# Patient Record
Sex: Male | Born: 1985
Health system: Southern US, Community
[De-identification: ages and names within clinical notes are randomized; demographics above are authoritative.]

## PROBLEM LIST (undated history)

## (undated) DIAGNOSIS — U071 COVID-19: Secondary | ICD-10-CM

## (undated) HISTORY — PX: SHOULDER SURGERY: SHX246

---

## 2004-10-15 ENCOUNTER — Encounter: Admission: RE | Admit: 2004-10-15 | Discharge: 2004-10-15 | Payer: Self-pay | Admitting: Family Medicine

## 2005-04-12 ENCOUNTER — Encounter: Admission: RE | Admit: 2005-04-12 | Discharge: 2005-04-12 | Payer: Self-pay | Admitting: Family Medicine

## 2005-04-14 ENCOUNTER — Encounter: Admission: RE | Admit: 2005-04-14 | Discharge: 2005-04-14 | Payer: Self-pay | Admitting: Family Medicine

## 2006-01-05 ENCOUNTER — Ambulatory Visit: Payer: Self-pay | Admitting: Family Medicine

## 2007-07-18 ENCOUNTER — Ambulatory Visit: Payer: Self-pay | Admitting: Family Medicine

## 2007-07-19 ENCOUNTER — Encounter: Admission: RE | Admit: 2007-07-19 | Discharge: 2007-07-19 | Payer: Self-pay | Admitting: Family Medicine

## 2009-02-07 HISTORY — PX: KNEE SURGERY: SHX244

## 2009-10-28 ENCOUNTER — Encounter: Admission: RE | Admit: 2009-10-28 | Discharge: 2009-10-28 | Payer: Self-pay | Admitting: Family Medicine

## 2009-10-28 ENCOUNTER — Ambulatory Visit: Payer: Self-pay | Admitting: Physician Assistant

## 2009-10-29 ENCOUNTER — Encounter: Admission: RE | Admit: 2009-10-29 | Discharge: 2009-10-29 | Payer: Self-pay | Admitting: Family Medicine

## 2009-11-30 ENCOUNTER — Ambulatory Visit: Payer: Self-pay | Admitting: Family Medicine

## 2009-12-01 ENCOUNTER — Encounter: Admission: RE | Admit: 2009-12-01 | Discharge: 2009-12-01 | Payer: Self-pay | Admitting: Family Medicine

## 2009-12-05 ENCOUNTER — Observation Stay (HOSPITAL_COMMUNITY): Admission: EM | Admit: 2009-12-05 | Discharge: 2009-12-06 | Payer: Self-pay | Admitting: Emergency Medicine

## 2010-02-28 ENCOUNTER — Encounter: Payer: Self-pay | Admitting: Internal Medicine

## 2010-04-21 LAB — URINALYSIS, ROUTINE W REFLEX MICROSCOPIC
Bilirubin Urine: NEGATIVE
Nitrite: NEGATIVE
Specific Gravity, Urine: 1.011 (ref 1.005–1.030)
pH: 7.5 (ref 5.0–8.0)

## 2010-04-21 LAB — DIFFERENTIAL
Basophils Absolute: 0 10*3/uL (ref 0.0–0.1)
Eosinophils Absolute: 0.1 10*3/uL (ref 0.0–0.7)
Eosinophils Relative: 1 % (ref 0–5)
Monocytes Absolute: 0.7 10*3/uL (ref 0.1–1.0)

## 2010-04-21 LAB — COMPREHENSIVE METABOLIC PANEL
ALT: 22 U/L (ref 0–53)
AST: 22 U/L (ref 0–37)
Albumin: 4.7 g/dL (ref 3.5–5.2)
CO2: 28 mEq/L (ref 19–32)
Chloride: 104 mEq/L (ref 96–112)
GFR calc Af Amer: >60 mL/min (ref >60)
GFR calc non Af Amer: >60 mL/min (ref >60)
Potassium: 3.4 mEq/L — ABNORMAL LOW (ref 3.5–5.1)
Sodium: 142 mEq/L (ref 135–145)
Total Bilirubin: 1.9 mg/dL — ABNORMAL HIGH (ref 0.3–1.2)

## 2010-04-21 LAB — CBC
Hemoglobin: 15.9 g/dL (ref 13.0–17.0)
RBC: 5.19 MIL/uL (ref 4.22–5.81)
WBC: 7.7 10*3/uL (ref 4.0–10.5)

## 2010-04-21 LAB — LIPASE, BLOOD: Lipase: 23 U/L (ref 11–59)

## 2010-09-29 ENCOUNTER — Telehealth: Payer: Self-pay | Admitting: Family Medicine

## 2010-09-30 ENCOUNTER — Telehealth: Payer: Self-pay

## 2010-09-30 NOTE — Telephone Encounter (Signed)
Called the pt to inform him that this wasn't an issue

## 2010-09-30 NOTE — Telephone Encounter (Signed)
Let him know that this is not an issue. He has nothing to worry about

## 2010-10-05 NOTE — Telephone Encounter (Signed)
CHERI CALLED PT KDS

## 2010-12-09 ENCOUNTER — Emergency Department (HOSPITAL_COMMUNITY)
Admission: EM | Admit: 2010-12-09 | Discharge: 2010-12-09 | Disposition: A | Discharge status: Home / Self Care | Payer: BC Managed Care – PPO | Attending: Emergency Medicine | Admitting: Emergency Medicine

## 2010-12-09 DIAGNOSIS — R1011 Right upper quadrant pain: Secondary | ICD-10-CM | POA: Insufficient documentation

## 2010-12-09 DIAGNOSIS — R197 Diarrhea, unspecified: Secondary | ICD-10-CM | POA: Insufficient documentation

## 2010-12-09 DIAGNOSIS — K5289 Other specified noninfective gastroenteritis and colitis: Secondary | ICD-10-CM | POA: Insufficient documentation

## 2010-12-09 LAB — URINALYSIS, ROUTINE W REFLEX MICROSCOPIC
Bilirubin Urine: NEGATIVE
Hgb urine dipstick: NEGATIVE
Ketones, ur: NEGATIVE mg/dL
Nitrite: NEGATIVE
Urobilinogen, UA: 0.2 mg/dL (ref 0.0–1.0)
pH: 6.5 (ref 5.0–8.0)

## 2010-12-09 LAB — DIFFERENTIAL

## 2010-12-09 LAB — COMPREHENSIVE METABOLIC PANEL
ALT: 17 U/L (ref 0–53)
AST: 16 U/L (ref 0–37)
Albumin: 4.5 g/dL (ref 3.5–5.2)
Calcium: 9.7 mg/dL (ref 8.4–10.5)
Creatinine, Ser: 1.03 mg/dL (ref 0.50–1.35)
GFR calc non Af Amer: >90 mL/min (ref >90)
Sodium: 143 mEq/L (ref 135–145)
Total Protein: 7.1 g/dL (ref 6.0–8.3)

## 2010-12-09 LAB — CBC
MCH: 31.1 pg (ref 26.0–34.0)
MCHC: 35.8 g/dL (ref 30.0–36.0)
MCV: 86.9 fL (ref 78.0–100.0)
Platelets: 206 10*3/uL (ref 150–400)
RBC: 5.18 MIL/uL (ref 4.22–5.81)
RDW: 11.5 % (ref 11.5–15.5)

## 2010-12-13 ENCOUNTER — Ambulatory Visit
Admission: RE | Admit: 2010-12-13 | Discharge: 2010-12-13 | Disposition: A | Discharge status: Home / Self Care | Payer: BC Managed Care – PPO | Source: Ambulatory Visit | Attending: Family Medicine | Admitting: Family Medicine

## 2010-12-13 ENCOUNTER — Other Ambulatory Visit: Payer: Self-pay | Admitting: Family Medicine

## 2010-12-13 DIAGNOSIS — R1011 Right upper quadrant pain: Secondary | ICD-10-CM

## 2012-01-02 ENCOUNTER — Ambulatory Visit (INDEPENDENT_AMBULATORY_CARE_PROVIDER_SITE_OTHER): Payer: BC Managed Care – PPO | Admitting: Family Medicine

## 2012-01-02 ENCOUNTER — Other Ambulatory Visit: Payer: Self-pay

## 2012-01-02 ENCOUNTER — Ambulatory Visit
Admission: RE | Admit: 2012-01-02 | Discharge: 2012-01-02 | Disposition: A | Discharge status: Home / Self Care | Payer: BC Managed Care – PPO | Source: Ambulatory Visit | Attending: Family Medicine | Admitting: Family Medicine

## 2012-01-02 VITALS — BP 118/78 | HR 76 | Temp 97.6°F | Resp 18 | Ht 71.25 in | Wt 149.8 lb

## 2012-01-02 DIAGNOSIS — R51 Headache: Secondary | ICD-10-CM

## 2012-01-02 LAB — POCT CBC
Hemoglobin: 15.9 g/dL (ref 14.1–18.1)
Lymph, poc: 2.1 (ref 0.6–3.4)
MCH, POC: 30.2 pg (ref 27–31.2)
MCHC: 32 g/dL (ref 31.8–35.4)
MPV: 10.5 fL (ref 0–99.8)
POC Granulocyte: 4.1 (ref 2–6.9)
POC LYMPH PERCENT: 32.5 %L (ref 10–50)
POC MID %: 6.1 %M (ref 0–12)
RDW, POC: 12.8 %
WBC: 6.6 10*3/uL (ref 4.6–10.2)

## 2012-01-02 LAB — TSH: TSH: 0.673 u[IU]/mL (ref 0.350–4.500)

## 2012-01-02 LAB — BASIC METABOLIC PANEL
Potassium: 4.3 mEq/L (ref 3.5–5.3)
Sodium: 143 mEq/L (ref 135–145)

## 2012-01-02 MED ORDER — HYDROCODONE-ACETAMINOPHEN 5-500 MG PO TABS: 1.0000 | ORAL_TABLET | Freq: Three times a day (TID) | ORAL | Status: DC | PRN

## 2012-01-02 MED ORDER — SUMATRIPTAN SUCCINATE 100 MG PO TABS: ORAL_TABLET | ORAL | Status: DC

## 2012-01-02 NOTE — Patient Instructions (Addendum)
You will go to [phone] [place] [facility]) at [facility] for your MRI.  You will receive IV contrast.  After your scan you may go home and I will call you with the result.  Assuming you are negative we will try treating you for a migraine HA

## 2012-01-02 NOTE — Progress Notes (Signed)
[facility] [address] [place] [address] [phone]  Date:  01/02/2012   Name:  Jeffrey Wells.   DOB:  10-Dec-1985   MRN:  [id number]  PCP:  No primary provider on file.    Chief Complaint: Headache   History of Present Illness:  Jeffrey L Lex Linhares. is a 26 y.o. very pleasant male patient who presents with the following:  He has noted HA for about 10 days.  This past weekend the headache seemed to settle in his forehead. It tends to be more unilateral.  He does not feel congested or like he has a sinus infection.    No fever, ST, congestion or cough.   His neck and back have felt sore as well.    He has a distant history of ?migraine HA- he was told he had migraines in the 8th grade.  However, he has not had anything like a migraine since his teens.    He states he rarely has HA.  When he was in the 8th grade it turned out that he needed glasses and his HAs resolved.    No head injury.    He has not had any vomiting, but he did have nausea over the weekend.  His stomach now feels better, but his appetite is not as good as unusual.    The HA has come and gone over the last 10 days.  He has woken up with the HA nearly every day.  He has tried ibuprofen, OTC migraine medication such as excedrin.    He is generally healthy.   No major changes recently in his life, no major stressors.   This morning he may have had a visual aura. He saw some spots in his vision.  His mother does have a history of migraine.     At the moment his HA is not as severe, but he has never had a HA that lasted so long. He is concerned about would like to pursue further evaluation including imaging.   There is no problem list on file for this patient.   No past medical history on file.  Past Surgical History  Procedure Date  . Knee surgery 2011    History  Substance Use Topics  . Smoking status: Former Games developer  . Smokeless tobacco: Not on file  . Alcohol Use:  Not on file    No family history on file.  No Known Allergies  Medication list has been reviewed and updated.  No current outpatient prescriptions on file prior to visit.    Review of Systems:  As per HPI- otherwise negative.   Physical Examination: Filed Vitals:   01/02/12 1125  BP: 118/78  Pulse: 76  Temp: 97.6 F (36.4 C)  Resp: 18   Filed Vitals:   01/02/12 1125  Height: 5' 11.25" (1.81 m)  Weight: 149 lb 12.8 oz (67.949 kg)   Body mass index is 20.75 kg/(m^2). Ideal Body Weight: Weight in (lb) to have BMI = 25: 180.1   GEN: WDWN, NAD, Non-toxic, A & O x 3.  Looks well and comfortable HEENT: Atraumatic, Normocephalic. Neck supple. No masses, No LAD. Bilateral TM wnl, oropharynx normal.  PEERL,EOMI.   Ears and Nose: No external deformity. CV: RRR, No M/G/R. No JVD. No thrill. No extra heart sounds. PULM: CTA B, no wheezes, crackles, rhonchi. No retractions. No resp. distress. No accessory muscle use. ABD: S, NT, ND, +BS. No rebound. No HSM. EXTR:  No c/c/e NEURO Normal gait. Normal strength, sensation, DTR all extremities.  Negative arm drift, no meningismus PSYCH: Normally interactive. Conversant. Not depressed or anxious appearing.  Calm demeanor.   Results for orders placed in visit on 01/02/12  POCT CBC      Component Value Range   WBC 6.6  4.6 - 10.2 K/uL   Lymph, poc 2.1  0.6 - 3.4   POC LYMPH PERCENT 32.5  10 - 50 %L   MID (cbc) 0.4  0 - 0.9   POC MID % 6.1  0 - 12 %M   POC Granulocyte 4.1  2 - 6.9   Granulocyte percent 61.4  37 - 80 %G   RBC 5.27  4.69 - 6.13 M/uL   Hemoglobin 15.9  14.1 - 18.1 g/dL   HCT, POC 40.9  81.1 - 53.7 %   MCV 94.3  80 - 97 fL   MCH, POC 30.2  27 - 31.2 pg   MCHC 32.0  31.8 - 35.4 g/dL   RDW, POC 91.4     Platelet Count, POC 255  142 - 424 K/uL   MPV 10.5  0 - 99.8 fL  GLUCOSE, POCT (MANUAL RESULT ENTRY)      Component Value Range   POC Glucose 75  70 - 99 mg/dl    Assessment and Plan: 1. Headache  POCT CBC, POCT  glucose (manual entry), Basic metabolic panel, TSH, MR Brain Wo Contrast   Nedra Hai is having an unusual HA today- we will perform an MRI with contrast.  He would like to go ahead with imaging as this HA is unusual for him.   Annalina Needles, MD  MRI HEAD WITHOUT CONTRAST  Technique: Multiplanar, multiecho pulse sequences of the brain and surrounding structures were obtained according to standard protocol without intravenous contrast.  Comparison: None.  Findings: No acute intracranial abnormalities are present. Specifically, there is no evidence for acute infarct, hemorrhage, mass, hydrocephalus, or significant extra-axial fluid collection. Flow is present in the major intracranial arteries. The globes and orbits are intact. Mild mucosal thickening is present along the inferior aspect of the maxillary sinuses bilaterally.  IMPRESSION:  1. Minimal maxillary sinus disease. 2. Normal MRI of the brain.   Called and let him know that his MRI was ok.  MRI accidentally done without contrast.  He will return tomorrow for a repeat with contrast.  He does have minimal sinus disease- however his symptoms and CBC do not suggest that this is the cause of his symptoms.  Suspect he is having migraine HA. Will still proceed with MRI with contrast tomorrow  For the time being called in hydrocodone (he has taken in the past for knee surgery and did well) and also imitrex that he can try for his HA.  Meds ordered this encounter  Medications  . SUMAtriptan (IMITREX) 100 MG tablet    Sig: Try a 1/2 pill for migraine headache.  May take a second half pill if needed.   Max two pills in 24 hours    Dispense:  10 tablet    Refill:  0  . HYDROcodone-acetaminophen (VICODIN) 5-500 MG per tablet    Sig: Take 1 tablet by mouth every 8 (eight) hours as needed for pain.    Dispense:  20 tablet    Refill:  0

## 2012-01-03 ENCOUNTER — Other Ambulatory Visit: Payer: BC Managed Care – PPO

## 2012-01-03 ENCOUNTER — Telehealth: Payer: Self-pay | Admitting: Family Medicine

## 2012-01-03 NOTE — Telephone Encounter (Signed)
Called to check on him- asked him to let me know if not better.  Unsure if he decided to go back for contrasted MRI or not today

## 2012-01-06 ENCOUNTER — Telehealth: Payer: Self-pay | Admitting: Family Medicine

## 2012-01-06 NOTE — Telephone Encounter (Signed)
Called and LMOM- I will send him a copy of his labs and MRI- if he still has a HA please let me know

## 2012-01-08 ENCOUNTER — Encounter: Payer: Self-pay | Admitting: Family Medicine

## 2012-05-07 ENCOUNTER — Ambulatory Visit (INDEPENDENT_AMBULATORY_CARE_PROVIDER_SITE_OTHER): Payer: BC Managed Care – PPO | Admitting: Internal Medicine

## 2012-05-07 VITALS — BP 130/78 | HR 82 | Temp 97.8°F | Resp 18 | Ht 76.0 in | Wt 146.4 lb

## 2012-05-07 DIAGNOSIS — H6121 Impacted cerumen, right ear: Secondary | ICD-10-CM

## 2012-05-07 DIAGNOSIS — H6120 Impacted cerumen, unspecified ear: Secondary | ICD-10-CM

## 2012-05-07 DIAGNOSIS — J019 Acute sinusitis, unspecified: Secondary | ICD-10-CM

## 2012-05-07 MED ORDER — AZITHROMYCIN 500 MG PO TABS: 500.0000 mg | ORAL_TABLET | Freq: Every day | ORAL | Status: DC

## 2012-05-07 NOTE — Patient Instructions (Signed)
Take meds as directed. Return to 104 for eval for add. If your symptoms worsen return to 102/

## 2012-05-07 NOTE — Progress Notes (Signed)
  Subjective:    Patient ID: Edwin Cap., male    DOB: 01-11-1986, 27 y.o.   MRN: [id number]  HPI Pressure in his head Facial pain Postnasal drip Fullness and decrease in hearing in his right ear Onset of symptoms yesterday worse today moderate in severity  No fever no cough no shortness of breath Also co difficulty concentrating when he is at night class after a full day at work wonders if he has add. Never tested and has not taken any meds for add  Review of Systems  Constitutional: Negative.   HENT: Positive for hearing loss, congestion and postnasal drip.   All other systems reviewed and are negative.       Objective:   Physical Exam  Nursing note and vitals reviewed. Constitutional: He is oriented to person, place, and time. He appears well-developed and well-nourished.  HENT:  Head: Normocephalic and atraumatic.  Left Ear: External ear normal.  Right ear with cerumen impaction  Eyes: Conjunctivae and EOM are normal. Pupils are equal, round, and reactive to light.  Neck: Normal range of motion. Neck supple.  Cardiovascular: Normal rate, regular rhythm, normal heart sounds and intact distal pulses.   Pulmonary/Chest: Effort normal and breath sounds normal.  Abdominal: Soft. Bowel sounds are normal.  Musculoskeletal: Normal range of motion.  Neurological: He is alert and oriented to person, place, and time. He has normal reflexes.  Skin: Skin is warm and dry.  Psychiatric: He has a normal mood and affect. His behavior is normal. Judgment and thought content normal.          Assessment & Plan:  sinusitus in otherwise well 27 year old male . Will rx with antibiotics and antihistamines. Also recommend debrox for cerumen impaction and will refer to 104 for evaluation of add.

## 2012-05-30 IMAGING — CT CT ABD-PELV W/O CM
2 of 4 series · 17 of 46 positions shown, 19 images · non-contrast
Comparison: None.

CLINICAL DATA: Flank pain, severe dysuria, low back pain.

CT ABDOMEN AND PELVIS WITHOUT CONTRAST
TECHNIQUE: Multidetector CT imaging of the abdomen and pelvis was
performed following the standard protocol without intravenous
contrast.

[Series 2: renal stone w/o · axial · non-contrast · 0.70mm/px · z∈[-436,-46]mm · 14 of 90 slices shown, 16 images]
[im 6/90  soft-tissue]
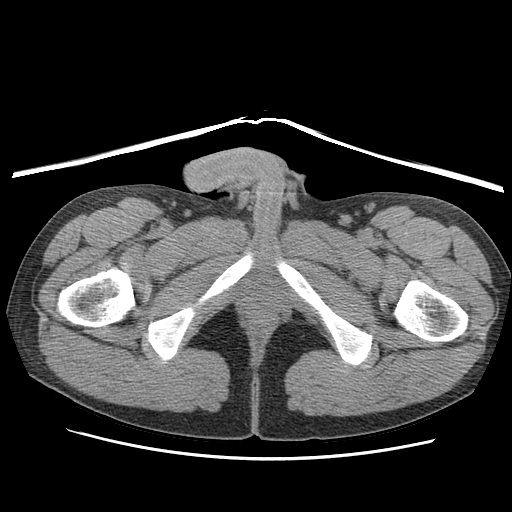
[im 6/90  bone]
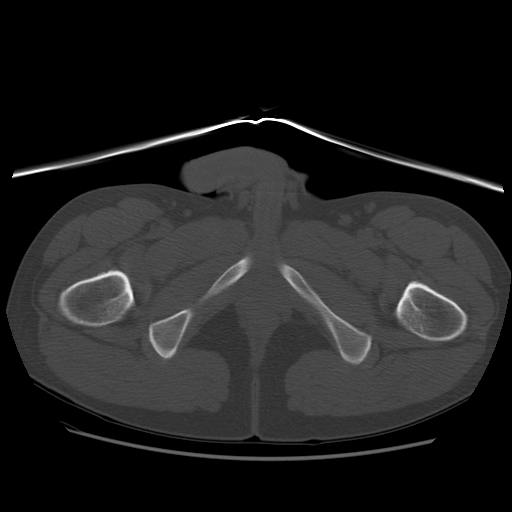
[im 12/90  soft-tissue]
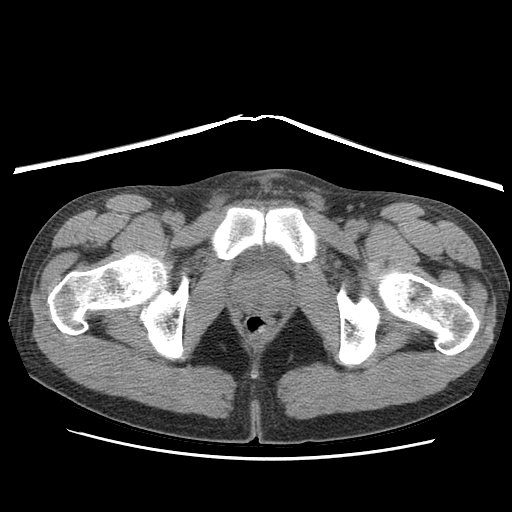
[im 18/90  soft-tissue]
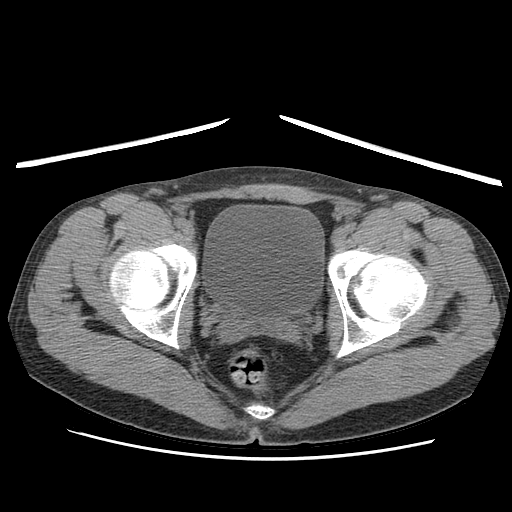
[im 24/90  soft-tissue]
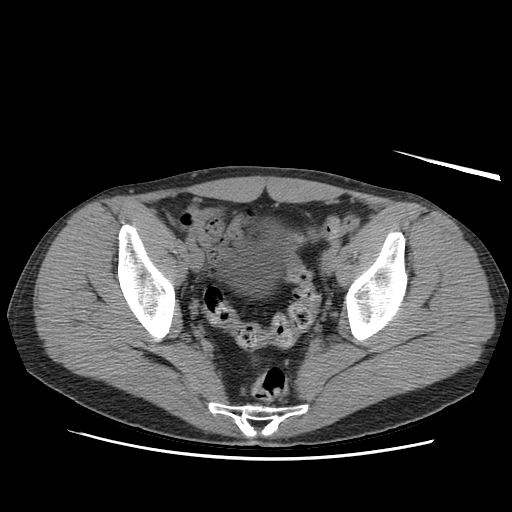
[im 30/90  soft-tissue]
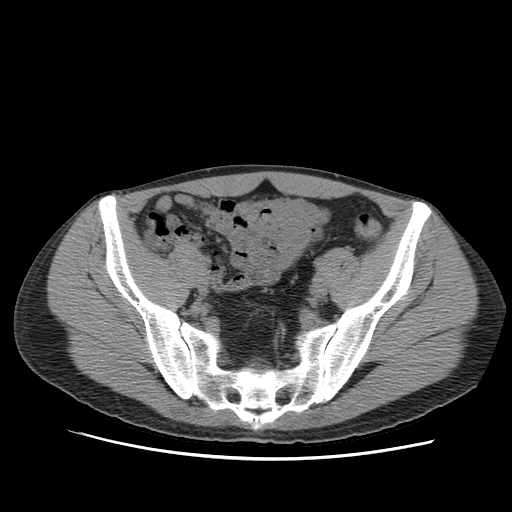
[im 36/90  soft-tissue]
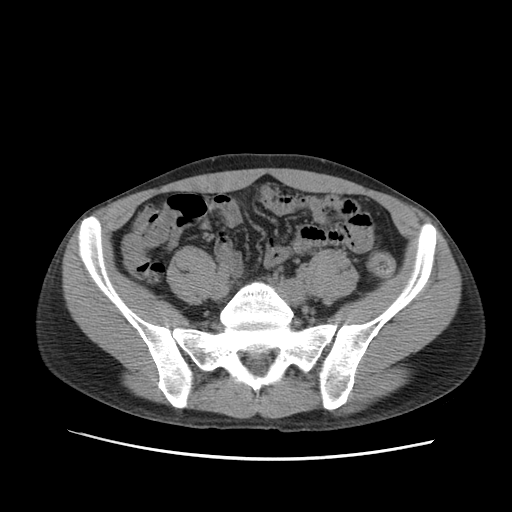
[im 42/90  soft-tissue]
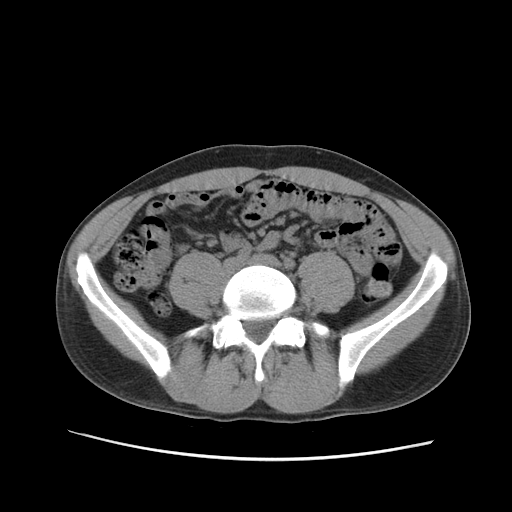
[im 48/90  soft-tissue]
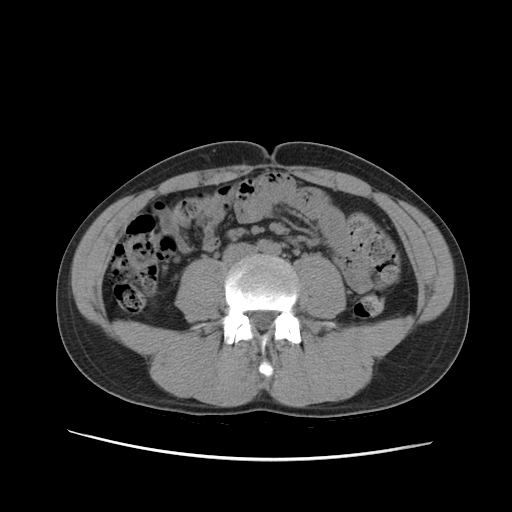
[im 54/90  soft-tissue]
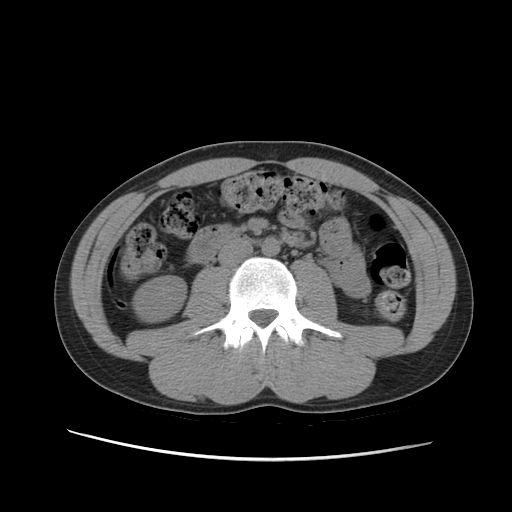
[im 54/90  bone]
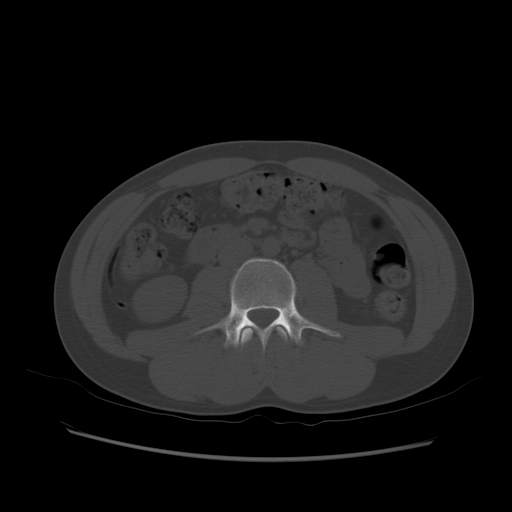
[im 60/90  soft-tissue]
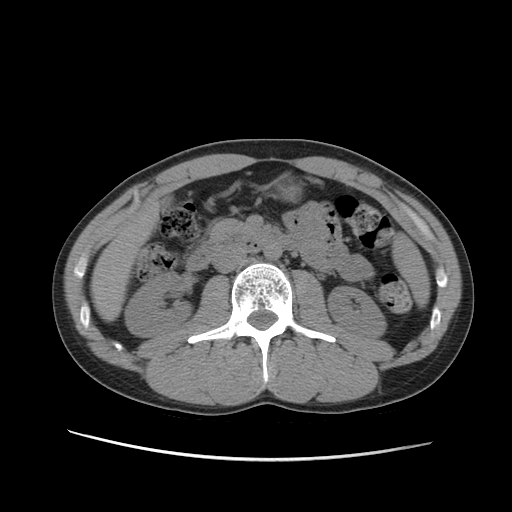
[im 66/90  soft-tissue]
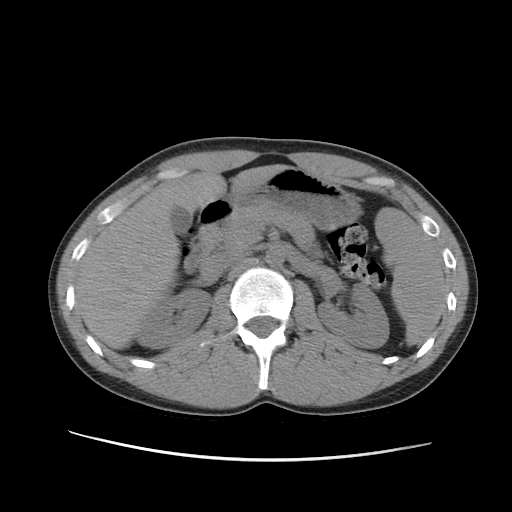
[im 72/90  soft-tissue]
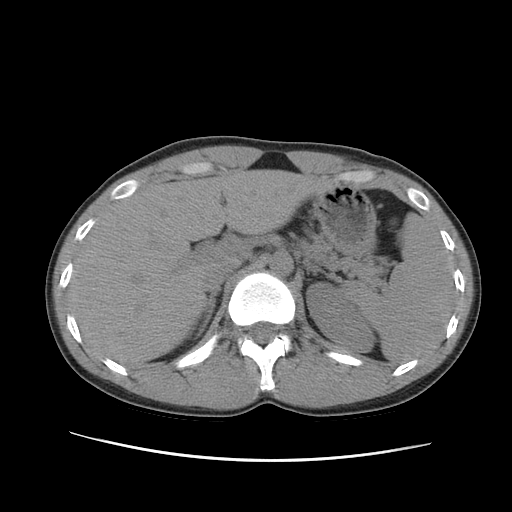
[im 78/90  soft-tissue]
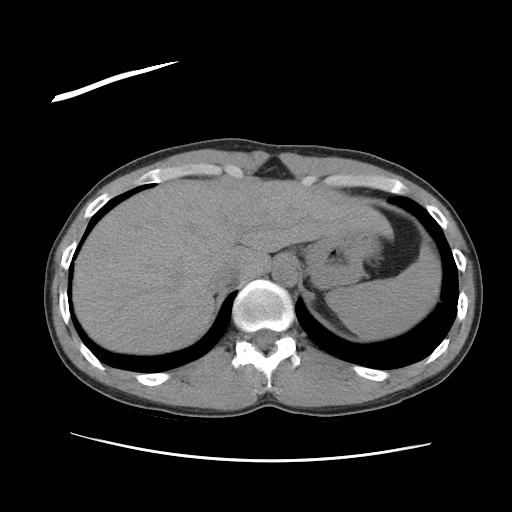
[im 84/90  soft-tissue]
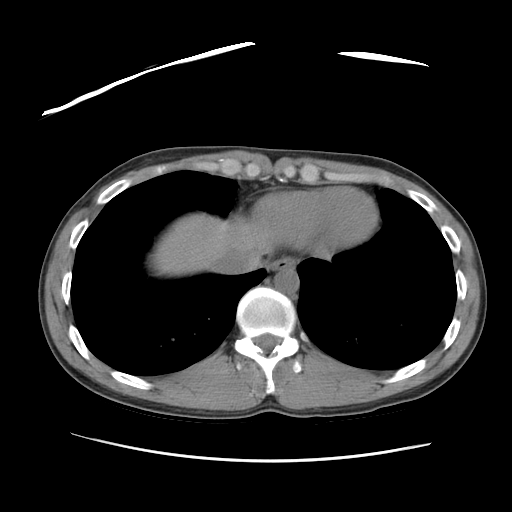

[Series 400: cor · coronal · 0.89mm/px · 3 of 88 slices shown]
[im 30/88  soft-tissue]
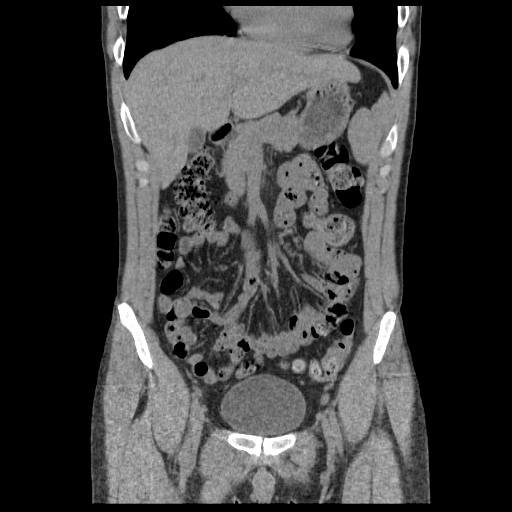
[im 39/88  soft-tissue]
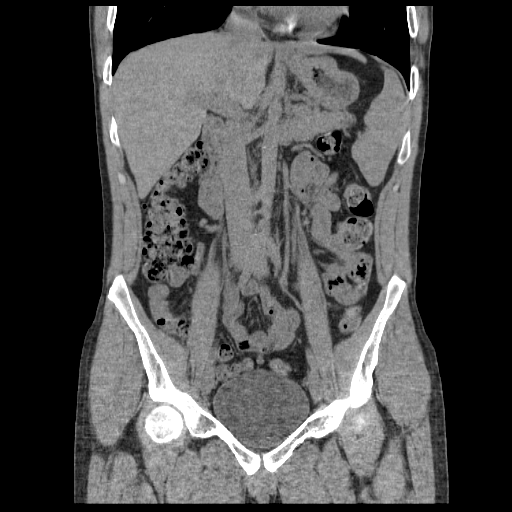
[im 49/88  soft-tissue]
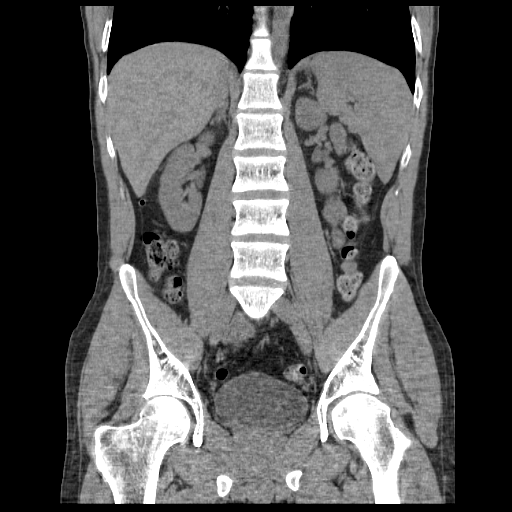

[17 of 46 positions shown; findings below may reference images not displayed]

FINDINGS: Lung bases show linear subsegmental atelectasis or
scarring in the left lower lobe.  Heart size normal.  No
pericardial or pleural effusion.

Liver, gallbladder, adrenal glands, kidneys, spleen, pancreas,
stomach and small bowel are unremarkable.  A fair amount of stool
is seen in the colon.  Appendix normal.  No pathologically enlarged
lymph nodes. No free fluid. No worrisome lytic or sclerotic
lesions.
IMPRESSION: No acute findings.

## 2016-05-25 DIAGNOSIS — M545 Low back pain: Secondary | ICD-10-CM | POA: Diagnosis not present

## 2016-05-28 DIAGNOSIS — M545 Low back pain: Secondary | ICD-10-CM | POA: Diagnosis not present

## 2017-01-24 DIAGNOSIS — Z0000 Encounter for general adult medical examination without abnormal findings: Secondary | ICD-10-CM | POA: Diagnosis not present

## 2017-07-28 DIAGNOSIS — J0100 Acute maxillary sinusitis, unspecified: Secondary | ICD-10-CM | POA: Diagnosis not present

## 2017-08-16 DIAGNOSIS — M545 Low back pain: Secondary | ICD-10-CM | POA: Diagnosis not present

## 2017-11-05 DIAGNOSIS — J324 Chronic pansinusitis: Secondary | ICD-10-CM | POA: Diagnosis not present

## 2017-12-18 ENCOUNTER — Emergency Department (HOSPITAL_COMMUNITY): Payer: 59

## 2017-12-18 ENCOUNTER — Encounter (HOSPITAL_COMMUNITY): Payer: Self-pay | Admitting: Internal Medicine

## 2017-12-18 ENCOUNTER — Emergency Department (HOSPITAL_COMMUNITY)
Admission: EM | Admit: 2017-12-18 | Discharge: 2017-12-18 | Disposition: A | Discharge status: Home / Self Care | Payer: 59 | Attending: Emergency Medicine | Admitting: Emergency Medicine

## 2017-12-18 DIAGNOSIS — R0789 Other chest pain: Secondary | ICD-10-CM | POA: Diagnosis not present

## 2017-12-18 DIAGNOSIS — R079 Chest pain, unspecified: Secondary | ICD-10-CM | POA: Diagnosis not present

## 2017-12-18 DIAGNOSIS — Z79899 Other long term (current) drug therapy: Secondary | ICD-10-CM | POA: Diagnosis not present

## 2017-12-18 DIAGNOSIS — Z87891 Personal history of nicotine dependence: Secondary | ICD-10-CM | POA: Diagnosis not present

## 2017-12-18 LAB — I-STAT TROPONIN, ED: TROPONIN I, POC: 0 ng/mL (ref 0.00–0.08)

## 2017-12-18 LAB — BASIC METABOLIC PANEL
ANION GAP: 7 (ref 5–15)
BUN: 11 mg/dL (ref 6–20)
CALCIUM: 9.2 mg/dL (ref 8.9–10.3)
CO2: 28 mmol/L (ref 22–32)
CREATININE: 0.96 mg/dL (ref 0.61–1.24)
Chloride: 101 mmol/L (ref 98–111)
GLUCOSE: 94 mg/dL (ref 70–99)
Potassium: 3.6 mmol/L (ref 3.5–5.1)
Sodium: 136 mmol/L (ref 135–145)

## 2017-12-18 LAB — CBC
HCT: 46.4 % (ref 39.0–52.0)
Hemoglobin: 15.4 g/dL (ref 13.0–17.0)
MCH: 29.9 pg (ref 26.0–34.0)
MCHC: 33.2 g/dL (ref 30.0–36.0)
MCV: 90.1 fL (ref 80.0–100.0)
PLATELETS: 240 10*3/uL (ref 150–400)
RBC: 5.15 MIL/uL (ref 4.22–5.81)
RDW: 11.7 % (ref 11.5–15.5)
WBC: 6.8 10*3/uL (ref 4.0–10.5)
nRBC: 0 % (ref 0.0–0.2)

## 2017-12-18 LAB — D-DIMER, QUANTITATIVE: D-Dimer, Quant: 0.29 ug/mL-FEU (ref 0.00–0.50)

## 2017-12-18 MED ORDER — METHOCARBAMOL 500 MG PO TABS: 500.0000 mg | ORAL_TABLET | Freq: Two times a day (BID) | ORAL | 0 refills | Status: DC

## 2017-12-18 MED ORDER — METHOCARBAMOL 500 MG PO TABS
500.0000 mg | ORAL_TABLET | Freq: Once | ORAL | Status: AC
  Administered 2017-12-18: 500 mg via ORAL
  Filled 2017-12-18: qty 1

## 2017-12-18 MED ORDER — NAPROXEN 500 MG PO TABS: 500.0000 mg | ORAL_TABLET | Freq: Two times a day (BID) | ORAL | 0 refills | Status: DC

## 2017-12-18 MED ORDER — MORPHINE SULFATE (PF) 4 MG/ML IV SOLN
4.0000 mg | Freq: Once | INTRAVENOUS | Status: AC
  Administered 2017-12-18: 4 mg via INTRAVENOUS
  Filled 2017-12-18: qty 1

## 2017-12-18 NOTE — ED Notes (Signed)
ED Provider at bedside. 

## 2017-12-18 NOTE — ED Notes (Signed)
Patient ambulatory to bathroom with steady gait at this time 

## 2017-12-18 NOTE — ED Triage Notes (Signed)
Pt here c/o new sharp chest pain that started yesterday morning. Pt states "I could not play legos with my kids without becoming short of breath." Pt sent here from urgent care this morning for evaluation. No cardiac/respiratory hx. VSS at this time.

## 2017-12-18 NOTE — ED Notes (Signed)
Patient given sandwich to eat

## 2017-12-18 NOTE — ED Provider Notes (Signed)
[facility] EMERGENCY DEPARTMENT Provider Note   CSN: [id number] Arrival date & time: 12/18/17  0932     History   Chief Complaint Chief Complaint  Patient presents with  . Chest Pain    HPI Jeffrey L Sachin Ferencz. is a 32 y.o. male with no signficant past medical history of presents to the ED today for chest pain and shortness of breath.  Patient reports that yesterday he awoke with a sharp pain over his left chest.  He reports that this was associated with shortness of breath and generalized fatigue.  Reports that the pain lasted for approximately 20-30 minutes before resolving.  Afterwards resolved the patient was left with a pressure-like sensation over his chest that is remained constant since that time.  He reports that his shortness of breath has been worse with exertion such as when he has tried to play Legos with his children.  He reports this usually does not make him short of breath.  He reports his chest pain is not worsened by exertion.  He feels at times it may be worse when he lies down.  He denies any pleuritic chest pain.  The patient denies any radiation of his pain to his neck, jaw, back, either shoulder or to his abdomen.  There is no associated nausea or diaphoresis.  He denies history of similar pain in the past.  Patient denies any associated fever, cough or lower leg swelling.  No recent illnesses.  Patient denies history of prior MI.  No history of diabetes, hypertension or hyperlipidemia.  He denies any daily medications or over-the-counter medications.  He denies illicit drug use.  No prior heart catheterizations, stress test or echocardiograms.  Patient does report family history of cardiac disease including his mother had an MI at the age of 64.  Patient is a former smoker and continues to use a vape pen intermittently when he drinks. Denies risk factors for PE including exogenous testosterone use, recent surgery,l recent travel (long care rides or plane  trips), trauma, immobilization, previous blood clot (PE/DVT), cough, hemoptysis, personal hx of cancer, lower extremity pain or swelling, or family history of clotting disorder.   HPI  History reviewed. No pertinent past medical history.  There are no active problems to display for this patient.   Past Surgical History:  Procedure Laterality Date  . KNEE SURGERY  2011        Home Medications    Prior to Admission medications   Medication Sig Start Date End Date Taking? Authorizing Provider  azithromycin (ZITHROMAX) 500 MG tablet Take 1 tablet (500 mg total) by mouth daily. 05/07/12   Glennie Isle L, DO  HYDROcodone-acetaminophen (VICODIN) 5-500 MG per tablet Take 1 tablet by mouth every 8 (eight) hours as needed for pain. 01/02/12   Copland, Gwenlyn Found, MD  SUMAtriptan (IMITREX) 100 MG tablet Try a 1/2 pill for migraine headache.  May take a second half pill if needed.   Max two pills in 24 hours 01/02/12   Copland, Gwenlyn Found, MD    Family History No family history on file.  Social History Social History   Tobacco Use  . Smoking status: Former Smoker  Substance Use Topics  . Alcohol use: Not on file  . Drug use: Not on file     Allergies   Patient has no known allergies.   Review of Systems Review of Systems  All other systems reviewed and are negative.    Physical Exam Updated Vital  Signs BP (!) 138/97   Pulse 75   Temp 97.7 F (36.5 C) (Oral)   Resp 14   Ht 6' (1.829 m)   Wt 68 kg   SpO2 100%   BMI 20.34 kg/m   Physical Exam  Constitutional: He appears well-developed and well-nourished.  HENT:  Head: Normocephalic and atraumatic.  Right Ear: External ear normal.  Left Ear: External ear normal.  Nose: Nose normal.  Mouth/Throat: Uvula is midline, oropharynx is clear and moist and mucous membranes are normal. No tonsillar exudate.  Eyes: Pupils are equal, round, and reactive to light. Right eye exhibits no discharge. Left eye exhibits no  discharge. No scleral icterus.  Neck: Trachea normal. Neck supple. No spinous process tenderness present. No neck rigidity. Normal range of motion present.  Cardiovascular: Normal rate, regular rhythm and intact distal pulses.  No murmur heard. Pulses:      Radial pulses are 2+ on the right side, and 2+ on the left side.       Dorsalis pedis pulses are 2+ on the right side, and 2+ on the left side.       Posterior tibial pulses are 2+ on the right side, and 2+ on the left side.  No lower extremity swelling or edema. Calves symmetric in size bilaterally.  Pulmonary/Chest: Effort normal and breath sounds normal. He exhibits tenderness.  Abdominal: Soft. Bowel sounds are normal. There is no tenderness. There is no rigidity, no rebound, no guarding and negative Murphy's sign.  Musculoskeletal: He exhibits no edema.  Lymphadenopathy:    He has no cervical adenopathy.  Neurological: He is alert.  Skin: Skin is warm and dry. No rash noted. He is not diaphoretic.  Psychiatric: He has a normal mood and affect.  Nursing note and vitals reviewed.    ED Treatments / Results  Labs (all labs ordered are listed, but only abnormal results are displayed) Labs Reviewed  BASIC METABOLIC PANEL  CBC  D-DIMER, QUANTITATIVE (NOT AT [facility])  I-STAT TROPONIN, ED    EKG EKG Interpretation  Date/Time:  Monday December 18 2017 09:42:55 EST Ventricular Rate:  70 PR Interval:    QRS Duration: 87 QT Interval:  395 QTC Calculation: 427 R Axis:   82 Text Interpretation:  Sinus rhythm Probable left atrial enlargement No previous tracing Confirmed by Cathren Laine ([id number]) on 12/18/2017 10:04:46 AM   Radiology Dg Chest 2 View  Result Date: 12/18/2017 CLINICAL DATA:  Chest pain. EXAM: CHEST - 2 VIEW COMPARISON:  Radiographs of October 29, 2009. FINDINGS: The heart size and mediastinal contours are within normal limits. Both lungs are clear. No pneumothorax or pleural effusion is noted. The visualized  skeletal structures are unremarkable. IMPRESSION: No active cardiopulmonary disease. Electronically Signed   By: Lupita Raider, M.D.   On: 12/18/2017 10:25    Procedures Procedures (including critical care time)  Medications Ordered in ED Medications  morphine 4 MG/ML injection 4 mg (has no administration in time range)  methocarbamol (ROBAXIN) tablet 500 mg (has no administration in time range)     Initial Impression / Assessment and Plan / ED Course  I have reviewed the triage vital signs and the nursing notes.  Pertinent labs & imaging results that were available during my care of the patient were reviewed by me and considered in my medical decision making (see chart for details).     32 y.o. male presenting for chest pain and sob that began yesterday and have been constant since onset. Chest  pain is non-exertional and without radiation, n/v or diaphoresis. Patient presented with chest pain to the ED. Patient is to be discharged with recommendation to follow up with PCP in regards to today's hospital visit. Chest pain is not likely of cardiac or pulmonary etiology due to presentation, perc & wells negative, negative d-dimer, stable vital signs, no tracheal deviation, no JVD or new murmur, RRR, breath sounds equal bilaterally, EKG without acute abnormalities, negative troponin, and negative CXR. HEART score is 2. Patient has been advised to return to the ED if chest pain becomes exertional, associated with diaphoresis or nausea, radiates to left jaw/arm, worsens or becomes concerning in any way. Patient appears reliable for follow up and is agreeable to discharge. I advised the patient to follow-up with their primary care provider this week. I advised the patient to return to the emergency department with new or worsening symptoms or new concerns. The patient verbalized understanding and agreement with plan. Patient stable for d/c. Discussed with my attending, Dr. Denton Lank who is in agreement.     Final Clinical Impressions(s) / ED Diagnoses   Final diagnoses:  Atypical chest pain    ED Discharge Orders         Ordered    methocarbamol (ROBAXIN) 500 MG tablet  2 times daily     12/18/17 1138    naproxen (NAPROSYN) 500 MG tablet  2 times daily     12/18/17 1138           Princella Pellegrini 12/18/17 1412    Cathren Laine, MD 12/19/17 [phone]

## 2017-12-18 NOTE — Discharge Instructions (Addendum)
Read instructions below for reasons to return to the Emergency Department. It is recommended that your follow up with your Primary Care Doctor in regards to today's visit. If you do not have a doctor, use the resource guide listed below to help you find one.   Tests performed today include: An EKG of your heart A chest x-ray D-Dimer Cardiac enzymes - a blood test for heart muscle damage Blood counts and electrolytes Vital signs. See below for your results today.   Muscle relaxants:  These medications can help with muscle tightness that is a cause of lower back pain.  Most of these medications can cause drowsiness, and it is not safe to drive or use dangerous machinery while taking them. They are primarily helpful when taken at night before sleep.  Chest Pain (Nonspecific)  HOME CARE INSTRUCTIONS  For the next few days, avoid physical activities that bring on chest pain. Continue physical activities as directed.  Do not smoke cigarettes or drink alcohol until your symptoms are gone. If you do smoke, it is time to quit. You may receive instructions and counseling on how to stop smoking. Only take over-the-counter or prescription medicine for pain, discomfort, or fever as directed by your caregiver.  Follow your caregiver's suggestions for further testing if your chest pain does not go away.  Keep any follow-up appointments you made. If you do not go to an appointment, you could develop lasting (chronic) problems with pain. If there is any problem keeping an appointment, you must call to reschedule.  SEEK MEDICAL CARE IF:  You think you are having problems from the medicine you are taking. Read your medicine instructions carefully.  Your chest pain does not go away, even after treatment.  You develop a rash with blisters on your chest.  SEEK IMMEDIATE MEDICAL CARE IF:  You have increased chest pain or pain that spreads to your arm, neck, jaw, back, or belly (abdomen).  You develop shortness of  breath, an increasing cough, or you are coughing up blood.  You have severe back or abdominal pain, feel sick to your stomach (nauseous) or throw up (vomit).  You develop severe weakness, fainting, or chills.  You have an oral temperature above 102 F (38.9 C), not controlled by medicine.   THIS IS AN EMERGENCY. Do not wait to see if the pain will go away. Get medical help at once. Call your local emergency services (911 in [place] Do not drive yourself to the hospital. Additional Information:  Your vital signs today were: BP 123/82 (BP Location: Left Arm)    Pulse 93    Temp 97.7 F (36.5 C) (Oral)    Resp 14    Ht 6' (1.829 m)    Wt 68 kg    SpO2 100%    BMI 20.34 kg/m  If your blood pressure (BP) was elevated above 135/85 this visit, please have this repeated by your doctor within one month. ---------------

## 2017-12-18 NOTE — ED Notes (Signed)
Patient transported to x-ray. ?

## 2017-12-20 ENCOUNTER — Encounter: Payer: Self-pay | Admitting: Cardiology

## 2017-12-20 ENCOUNTER — Ambulatory Visit (INDEPENDENT_AMBULATORY_CARE_PROVIDER_SITE_OTHER): Payer: 59 | Admitting: Cardiology

## 2017-12-20 DIAGNOSIS — R072 Precordial pain: Secondary | ICD-10-CM | POA: Diagnosis not present

## 2017-12-20 DIAGNOSIS — I300 Acute nonspecific idiopathic pericarditis: Secondary | ICD-10-CM

## 2017-12-20 DIAGNOSIS — I319 Disease of pericardium, unspecified: Secondary | ICD-10-CM | POA: Insufficient documentation

## 2017-12-20 NOTE — Patient Instructions (Signed)
Medication Instructions:  Your physician recommends that you continue on your current medications as directed. Please refer to the Current Medication list given to you today.  If you need a refill on your cardiac medications before your next appointment, please call your pharmacy.   Lab work: Your physician recommends that you return for lab work today: Sedimentation rate, and c reactive protein.   If you have labs (blood work) drawn today and your tests are completely normal, you will receive your results only by: Marland Kitchen. MyChart Message (if you have MyChart) OR . A paper copy in the mail If you have any lab test that is abnormal or we need to change your treatment, we will call you to review the results.  Testing/Procedures: Your physician has requested that you have an echocardiogram. Echocardiography is a painless test that uses sound waves to create images of your heart. It provides your doctor with information about the size and shape of your heart and how well your heart's chambers and valves are working. This procedure takes approximately one hour. There are no restrictions for this procedure.    Follow-Up: At [facility], you and your health needs are our priority.  As part of our continuing mission to provide you with exceptional heart care, we have created designated Provider Care Teams.  These Care Teams include your primary Cardiologist (physician) and Advanced Practice Providers (APPs -  Physician Assistants and Nurse Practitioners) who all work together to provide you with the care you need, when you need it. You will need a follow up appointment in 2 weeks.  Please call our office 2 months in advance to schedule this appointment.  You may see No primary care provider on file. or another member of our [facility] Provider Team in [place]: Norman HerrlichBrian Munley, MD . Belva Cromeajan Revankar, MD  Any Other Special Instructions Will Be Listed Below (If Applicable).  Echocardiogram An  echocardiogram, or echocardiography, uses sound waves (ultrasound) to produce an image of your heart. The echocardiogram is simple, painless, obtained within a short period of time, and offers valuable information to your health care provider. The images from an echocardiogram can provide information such as:  Evidence of coronary artery disease (CAD).  Heart size.  Heart muscle function.  Heart valve function.  Aneurysm detection.  Evidence of a past heart attack.  Fluid buildup around the heart.  Heart muscle thickening.  Assess heart valve function.  Tell a health care provider about:  Any allergies you have.  All medicines you are taking, including vitamins, herbs, eye drops, creams, and over-the-counter medicines.  Any problems you or family members have had with anesthetic medicines.  Any blood disorders you have.  Any surgeries you have had.  Any medical conditions you have.  Whether you are pregnant or may be pregnant. What happens before the procedure? No special preparation is needed. Eat and drink normally. What happens during the procedure?  In order to produce an image of your heart, gel will be applied to your chest and a wand-like tool (transducer) will be moved over your chest. The gel will help transmit the sound waves from the transducer. The sound waves will harmlessly bounce off your heart to allow the heart images to be captured in real-time motion. These images will then be recorded.  You may need an IV to receive a medicine that improves the quality of the pictures. What happens after the procedure? You may return to your normal schedule including diet, activities, and  medicines, unless your health care provider tells you otherwise. This information is not intended to replace advice given to you by your health care provider. Make sure you discuss any questions you have with your health care provider. Document Released: 01/22/2000 Document Revised:  09/12/2015 Document Reviewed: 10/01/2012 Elsevier Interactive Patient Education  2017 [employer].

## 2017-12-20 NOTE — Progress Notes (Signed)
Cardiology Consultation:    Date:  12/20/2017   ID:  Jeffrey CapJohnny L Mcnulty Jr., DOB 10/29/1985, MRN [id number]  PCP:  Patient, No Pcp Per  Cardiologist:  Gypsy Balsamobert Mazikeen Hehn, MD   Referring MD: No ref. provider found   Chief Complaint  Patient presents with  . ER follow up  Chest pain  History of Present Illness:    Jeffrey CapJohnny L Sybert Jr. is a 32 y.o. male who is being seen today for the evaluation of chest pain at the request of No ref. provider found.  He woke up on Sunday with stabbing sharp-like chest pain located in the left side of his chest shortly after that he started having pressure-like sensation he great pressure-like 4-5 pain scale up to 10 and then sharp sensation about 7.  Pressure-like sensation lasted all day and all night sharp sensation only for split second he will went to work however was tired and exhausted and decided to go back home after that he went to the emergency room in the emergency room troponin I was checked which was normal he also have d-dimer checked which was normal he was sent home with nonsteroidal anti-inflammatory medication he cannot tell me if he feels any better pressure sensations continues to be dark also partially reproducible pain with pressing chest wall partially when he leaned forward to the pain will get worse or when he lays down the pain will get worse he is also complained of being weak exhausted and tired as well as some short of breath when he tried to do some exercise.  There is no chest pain that would be provoked by exercise.  No past medical history on file.  Past Surgical History:  Procedure Laterality Date  . KNEE SURGERY  2011  . SHOULDER SURGERY Left     Current Medications: Current Meds  Medication Sig  . methocarbamol (ROBAXIN) 500 MG tablet Take 1 tablet (500 mg total) by mouth 2 (two) times daily.  . naproxen (NAPROSYN) 500 MG tablet Take 1 tablet (500 mg total) by mouth 2 (two) times daily.     Allergies:   Patient has no  known allergies.   Social History   Socioeconomic History  . Marital status: Married    Spouse name: Not on file  . Number of children: Not on file  . Years of education: Not on file  . Highest education level: Not on file  Occupational History  . Not on file  Social Needs  . Financial resource strain: Not on file  . Food insecurity:    Worry: Not on file    Inability: Not on file  . Transportation needs:    Medical: Not on file    Non-medical: Not on file  Tobacco Use  . Smoking status: Former Games developermoker  . Smokeless tobacco: Never Used  Substance and Sexual Activity  . Alcohol use: Yes    Comment: rare  . Drug use: Not Currently  . Sexual activity: Not on file  Lifestyle  . Physical activity:    Days per week: Not on file    Minutes per session: Not on file  . Stress: Not on file  Relationships  . Social connections:    Talks on phone: Not on file    Gets together: Not on file    Attends religious service: Not on file    Active member of club or organization: Not on file    Attends meetings of clubs or organizations: Not on file  Relationship status: Not on file  Other Topics Concern  . Not on file  Social History Narrative  . Not on file     Family History: The patient's family history includes Heart attack in his mother and paternal grandfather; Hypertension in his father. ROS:   Please see the history of present illness.    All 14 point review of systems negative except as described per history of present illness.  EKGs/Labs/Other Studies Reviewed:    The following studies were reviewed today: Troponin I normal d-dimer normal  EKG:  EKG is  ordered today.  The ekg ordered today demonstrates   EKG showed normal sinus rhythm PR segment depression normal QS complex duration morphology no ST segment changes  Recent Labs: 12/18/2017: BUN 11; Creatinine, Ser 0.96; Hemoglobin 15.4; Platelets 240; Potassium 3.6; Sodium 136  Recent Lipid Panel No results  found for: CHOL, TRIG, HDL, CHOLHDL, VLDL, LDLCALC, LDLDIRECT  Physical Exam:    VS:  BP 108/72   Pulse 98   Ht 6' (1.829 m)   Wt 146 lb (66.2 kg)   SpO2 98%   BMI 19.80 kg/m     Wt Readings from Last 3 Encounters:  12/20/17 146 lb (66.2 kg)  12/18/17 150 lb (68 kg)  05/07/12 146 lb 6.4 oz (66.4 kg)     GEN:  Well nourished, well developed in no acute distress HEENT: Normal NECK: No JVD; No carotid bruits LYMPHATICS: No lymphadenopathy CARDIAC: RRR, no murmurs, no rubs, no gallops RESPIRATORY:  Clear to auscultation without rales, wheezing or rhonchi  ABDOMEN: Soft, non-tender, non-distended MUSCULOSKELETAL:  No edema; No deformity  SKIN: Warm and dry NEUROLOGIC:  Alert and oriented x 3 PSYCHIATRIC:  Normal affect   ASSESSMENT:    1. Precordial chest pain   2. Acute idiopathic pericarditis    PLAN:    In order of problems listed above:  1. I suspect this is pericarditis.  He is already taking Naprosyn which I advised him to continue I will check his sedimentation rate as C-reactive protein if those numbers are elevated then I will initiate colchicine on top of nonsteroidal anti-inflammatory medications.  He will also be scheduled to have echocardiogram to look at left ventricular ejection fraction as well as check for pericardial effusion I do not see any evidence of tamponade based on my physical examination there is no JVD at all even when he lays flat.  We will see him in the office rather quickly within the next 2 weeks or so to see if he feels any better.  Again if this is truly pericarditis anticipate needs to add colchicine to his medical regimen. 2.    Medication Adjustments/Labs and Tests Ordered: Current medicines are reviewed at length with the patient today.  Concerns regarding medicines are outlined above.  No orders of the defined types were placed in this encounter.  No orders of the defined types were placed in this encounter.   Signed, Georgeanna Lea, MD, [facility]. 12/20/2017 4:21 PM    [facility]

## 2017-12-21 LAB — C-REACTIVE PROTEIN

## 2017-12-21 LAB — SEDIMENTATION RATE: Sed Rate: 4 mm/hr (ref 0–15)

## 2017-12-28 ENCOUNTER — Telehealth: Payer: Self-pay | Admitting: Emergency Medicine

## 2017-12-28 ENCOUNTER — Ambulatory Visit (HOSPITAL_BASED_OUTPATIENT_CLINIC_OR_DEPARTMENT_OTHER)
Admission: RE | Admit: 2017-12-28 | Discharge: 2017-12-28 | Disposition: A | Discharge status: Home / Self Care | Payer: 59 | Source: Ambulatory Visit | Attending: Cardiology | Admitting: Cardiology

## 2017-12-28 DIAGNOSIS — R072 Precordial pain: Secondary | ICD-10-CM | POA: Diagnosis not present

## 2017-12-28 NOTE — Telephone Encounter (Signed)
Patient went to [place] office requesting a work Physicist, medicalletter. I called patient for clarification. On this. He reports feeling somewhat better, however his pain returns on exertion and especially at night time. He has asked we write his work note and he will pick up next week in [place]. I will consult with Dr. Bing MatterKrasowski if any changes should be made.

## 2017-12-28 NOTE — Progress Notes (Signed)
  Echocardiogram 2D Echocardiogram has been performed.  Jeffrey Wells T Jeffrey Wells 12/28/2017, 11:52 AM

## 2018-01-01 ENCOUNTER — Encounter: Payer: Self-pay | Admitting: Emergency Medicine

## 2018-01-08 ENCOUNTER — Telehealth: Payer: Self-pay | Admitting: Cardiology

## 2018-01-08 NOTE — Telephone Encounter (Signed)
No to Robaxin   Continue meds for [facility] visit   Needs office fu next week re pericaditis

## 2018-01-08 NOTE — Telephone Encounter (Signed)
Attempted to return call. Voicemail box was full. Will continue efforts.

## 2018-01-08 NOTE — Telephone Encounter (Signed)
Patient reports still having some chest pressure and sharp pains in the middle of his chest at times mainly when he lays down. Denies any radiation of pain, denies shortness of breath, denies nausea. Patient advised to go to the emergerncy department if having active chest pain patient verbally understands. He reports being prescribed naproxen and robaxin after his emergency department visit. He is out of robaxin which was relieving his pain wants to know if he should continue and if we can refill? Will route to Dr. Dulce SellarMunley for him to advise and contact patient. Patient verbally understands.

## 2018-01-09 NOTE — Telephone Encounter (Signed)
Patient informed to continue current medication. His appointment was moved to next week, patient informed. Advised patient to call back with any other concerns

## 2018-01-17 ENCOUNTER — Encounter: Payer: Self-pay | Admitting: Cardiology

## 2018-01-17 ENCOUNTER — Ambulatory Visit (INDEPENDENT_AMBULATORY_CARE_PROVIDER_SITE_OTHER): Payer: 59 | Admitting: Cardiology

## 2018-01-17 VITALS — BP 134/64 | HR 98 | Ht 72.0 in | Wt 148.0 lb

## 2018-01-17 DIAGNOSIS — I300 Acute nonspecific idiopathic pericarditis: Secondary | ICD-10-CM | POA: Diagnosis not present

## 2018-01-17 DIAGNOSIS — R072 Precordial pain: Secondary | ICD-10-CM | POA: Diagnosis not present

## 2018-01-17 MED ORDER — COLCHICINE 0.6 MG PO TABS: 0.6000 mg | ORAL_TABLET | Freq: Every day | ORAL | 1 refills | Status: DC

## 2018-01-17 NOTE — Patient Instructions (Signed)
Medication Instructions:  Your physician has recommended you make the following change in your medication:   START: Colchicine 0.6 mg daily.  If you need a refill on your cardiac medications before your next appointment, please call your pharmacy.   Lab work: None.  If you have labs (blood work) drawn today and your tests are completely normal, you will receive your results only by: Marland Kitchen. MyChart Message (if you have MyChart) OR . A paper copy in the mail If you have any lab test that is abnormal or we need to change your treatment, we will call you to review the results.  Testing/Procedures: None.   Follow-Up: At [facility], you and your health needs are our priority.  As part of our continuing mission to provide you with exceptional heart care, we have created designated Provider Care Teams.  These Care Teams include your primary Cardiologist (physician) and Advanced Practice Providers (APPs -  Physician Assistants and Nurse Practitioners) who all work together to provide you with the care you need, when you need it. You will need a follow up appointment in 1 months.  Please call our office 2 months in advance to schedule this appointment.  You may see Gypsy Balsamobert Krasowski, MD or another member of our [facility] Provider Team in [place]: Norman HerrlichBrian Munley, MD . Belva Cromeajan Revankar, MD  Any Other Special Instructions Will Be Listed Below (If Applicable).

## 2018-01-17 NOTE — Addendum Note (Signed)
Addended by: Lita MainsBOWMAN, Tinisha Etzkorn R on: 01/17/2018 02:23 PM   Modules accepted: Orders

## 2018-01-17 NOTE — Progress Notes (Signed)
Cardiology Office Note:    Date:  01/17/2018   ID:  Jeffrey Cap., DOB January 26, 1986, MRN [id number]  PCP:  Patient, No Pcp Per  Cardiologist:  Gypsy Balsam, MD    Referring MD: No ref. provider found   Chief Complaint  Patient presents with  . Follow up on Echo  Still having chest pain  History of Present Illness:    Jeffrey L Keeghan Bialy. is a 32 y.o. male with pericarditis.  Interestingly his symptoms suggest pericarditis his EKG showed some PR depression suggesting pericarditis he was started on Naprosyn by ER after that I did inflammatory markers and both C-reactive protein as well as sedimentation rate was negative after that he did have echocardiogram done which showed thickening of the pericardium with minimal pericardial effusion without tamponade.  It looks like he truly does have pericarditis still complaining of having pain it sharp stabbing lasting only for split second.  He takes Naprosyn with limited response.  I think it is time to initiate colchicine.  I gave him 0.6 mg once daily and see if it helps I warned him about potential side effects which include diarrhea.  I would like to see him back in my office in about a month however I want him to let me know within a week if he feels any better I hope that we will be able to avoid steroids.  We did talk in length about what pericarditis is we talked about potential complications of pericarditis including tamponade or constrictive physiology.  He does not have any shortness of breath tightness squeezing pressure burning chest.  No past medical history on file.  Past Surgical History:  Procedure Laterality Date  . KNEE SURGERY  2011  . SHOULDER SURGERY Left     Current Medications: Current Meds  Medication Sig  . methocarbamol (ROBAXIN) 500 MG tablet Take 1 tablet (500 mg total) by mouth 2 (two) times daily.  . naproxen (NAPROSYN) 500 MG tablet Take 1 tablet (500 mg total) by mouth 2 (two) times daily.  . traMADol  (ULTRAM) 50 MG tablet Take 50 mg by mouth as needed.      Allergies:   Patient has no known allergies.   Social History   Socioeconomic History  . Marital status: Married    Spouse name: Not on file  . Number of children: Not on file  . Years of education: Not on file  . Highest education level: Not on file  Occupational History  . Not on file  Social Needs  . Financial resource strain: Not on file  . Food insecurity:    Worry: Not on file    Inability: Not on file  . Transportation needs:    Medical: Not on file    Non-medical: Not on file  Tobacco Use  . Smoking status: Former Games developer  . Smokeless tobacco: Never Used  Substance and Sexual Activity  . Alcohol use: Yes    Comment: rare  . Drug use: Not Currently  . Sexual activity: Not on file  Lifestyle  . Physical activity:    Days per week: Not on file    Minutes per session: Not on file  . Stress: Not on file  Relationships  . Social connections:    Talks on phone: Not on file    Gets together: Not on file    Attends religious service: Not on file    Active member of club or organization: Not on file    Attends  meetings of clubs or organizations: Not on file    Relationship status: Not on file  Other Topics Concern  . Not on file  Social History Narrative  . Not on file     Family History: The patient's family history includes Heart attack in his mother and paternal grandfather; Hypertension in his father. ROS:   Please see the history of present illness.    All 14 point review of systems negative except as described per history of present illness  EKGs/Labs/Other Studies Reviewed:      Recent Labs: 12/18/2017: BUN 11; Creatinine, Ser 0.96; Hemoglobin 15.4; Platelets 240; Potassium 3.6; Sodium 136  Recent Lipid Panel No results found for: CHOL, TRIG, HDL, CHOLHDL, VLDL, LDLCALC, LDLDIRECT  Physical Exam:    VS:  BP 134/64   Pulse 98   Ht 6' (1.829 m)   Wt 148 lb (67.1 kg)   SpO2 98%   BMI  20.07 kg/m     Wt Readings from Last 3 Encounters:  01/17/18 148 lb (67.1 kg)  12/20/17 146 lb (66.2 kg)  12/18/17 150 lb (68 kg)     GEN:  Well nourished, well developed in no acute distress HEENT: Normal NECK: No JVD; No carotid bruits LYMPHATICS: No lymphadenopathy CARDIAC: RRR, no murmurs, no rubs, no gallops RESPIRATORY:  Clear to auscultation without rales, wheezing or rhonchi  ABDOMEN: Soft, non-tender, non-distended MUSCULOSKELETAL:  No edema; No deformity  SKIN: Warm and dry LOWER EXTREMITIES: no swelling NEUROLOGIC:  Alert and oriented x 3 PSYCHIATRIC:  Normal affect   ASSESSMENT:    1. Acute idiopathic pericarditis   2. Precordial chest pain    PLAN:    In order of problems listed above:  1. Acute idiopathic pericarditis will initiate colchicine continue Naprosyn. 2. Precordial chest pain related to pericarditis. 3. No clinical evidence of tamponade.  He was advised to avoid strenuous exercise.   Medication Adjustments/Labs and Tests Ordered: Current medicines are reviewed at length with the patient today.  Concerns regarding medicines are outlined above.  No orders of the defined types were placed in this encounter.  Medication changes: No orders of the defined types were placed in this encounter.   Signed, Georgeanna Leaobert J. Pepe Mineau, MD, [facility] 01/17/2018 2:11 PM    [facility]

## 2018-01-23 ENCOUNTER — Ambulatory Visit: Payer: 59 | Admitting: Cardiology

## 2018-01-24 ENCOUNTER — Telehealth: Payer: Self-pay | Admitting: Cardiology

## 2018-01-24 MED ORDER — COLCHICINE 0.6 MG PO TABS: 0.6000 mg | ORAL_TABLET | Freq: Every day | ORAL | 1 refills | Status: DC

## 2018-01-24 NOTE — Telephone Encounter (Signed)
Medication sent to [facility]. Patient aware.

## 2018-01-24 NOTE — Telephone Encounter (Signed)
[facility]

## 2018-02-02 DIAGNOSIS — I519 Heart disease, unspecified: Secondary | ICD-10-CM | POA: Insufficient documentation

## 2018-02-02 DIAGNOSIS — Z0000 Encounter for general adult medical examination without abnormal findings: Secondary | ICD-10-CM | POA: Diagnosis not present

## 2018-02-16 ENCOUNTER — Ambulatory Visit (INDEPENDENT_AMBULATORY_CARE_PROVIDER_SITE_OTHER): Payer: 59 | Admitting: Cardiology

## 2018-02-16 ENCOUNTER — Encounter: Payer: Self-pay | Admitting: Cardiology

## 2018-02-16 VITALS — BP 120/84 | HR 90 | Ht 72.0 in | Wt 148.0 lb

## 2018-02-16 DIAGNOSIS — I300 Acute nonspecific idiopathic pericarditis: Secondary | ICD-10-CM

## 2018-02-16 NOTE — Progress Notes (Signed)
Cardiology Office Note:    Date:  02/16/2018   ID:  Jeffrey Avalo., DOB [birth year], MRN [id number]  PCP:  Patient, No Pcp Per  Cardiologist:  Gypsy Balsam, MD    Referring MD: No ref. provider found   No chief complaint on file. Doing much better  History of Present Illness:    Jeffrey L Renford Grammatico. is a 33 y.o. male with pericarditis.  Initially managed only with nonsteroidal anti-inflammatory medication with not much response now he is taking colchicine which seems to be helping still complain of being weak tired exhausted but overall seems to be doing well.  Few days ago he was able to play with his children have no difficulty doing it  History reviewed. No pertinent past medical history.  Past Surgical History:  Procedure Laterality Date  . KNEE SURGERY  2011  . SHOULDER SURGERY Left     Current Medications: No outpatient medications have been marked as taking for the 02/16/18 encounter (Office Visit) with Jeffrey Lea, MD.     Allergies:   Patient has no known allergies.   Social History   Socioeconomic History  . Marital status: Married    Spouse name: Not on file  . Number of children: Not on file  . Years of education: Not on file  . Highest education level: Not on file  Occupational History  . Not on file  Social Needs  . Financial resource strain: Not on file  . Food insecurity:    Worry: Not on file    Inability: Not on file  . Transportation needs:    Medical: Not on file    Non-medical: Not on file  Tobacco Use  . Smoking status: Former Games developer  . Smokeless tobacco: Never Used  Substance and Sexual Activity  . Alcohol use: Yes    Comment: rare  . Drug use: Not Currently  . Sexual activity: Not on file  Lifestyle  . Physical activity:    Days per week: Not on file    Minutes per session: Not on file  . Stress: Not on file  Relationships  . Social connections:    Talks on phone: Not on file    Gets together: Not on file   Attends religious service: Not on file    Active member of club or organization: Not on file    Attends meetings of clubs or organizations: Not on file    Relationship status: Not on file  Other Topics Concern  . Not on file  Social History Narrative  . Not on file     Family History: The patient's family history includes Heart attack in his mother and paternal grandfather; Hypertension in his father. ROS:   Please see the history of present illness.    All 14 point review of systems negative except as described per history of present illness  EKGs/Labs/Other Studies Reviewed:      Recent Labs: 12/18/2017: BUN 11; Creatinine, Ser 0.96; Hemoglobin 15.4; Platelets 240; Potassium 3.6; Sodium 136  Recent Lipid Panel No results found for: CHOL, TRIG, HDL, CHOLHDL, VLDL, LDLCALC, LDLDIRECT  Physical Exam:    VS:  BP 120/84 (BP Location: Left Arm, Patient Position: Sitting, Cuff Size: Normal)   Pulse 90   Ht 6' (1.829 m)   Wt 148 lb (67.1 kg)   SpO2 96%   BMI 20.07 kg/m     Wt Readings from Last 3 Encounters:  02/16/18 148 lb (67.1 kg)  01/17/18 148 lb (  67.1 kg)  12/20/17 146 lb (66.2 kg)     GEN:  Well nourished, well developed in no acute distress HEENT: Normal NECK: No JVD; No carotid bruits LYMPHATICS: No lymphadenopathy CARDIAC: RRR, no murmurs, no rubs, no gallops RESPIRATORY:  Clear to auscultation without rales, wheezing or rhonchi  ABDOMEN: Soft, non-tender, non-distended MUSCULOSKELETAL:  No edema; No deformity  SKIN: Warm and dry LOWER EXTREMITIES: no swelling NEUROLOGIC:  Alert and oriented x 3 PSYCHIATRIC:  Normal affect   ASSESSMENT:    1. Acute idiopathic pericarditis    PLAN:    In order of problems listed above:  1. Pericarditis doing better with colchicine which I will continue I will check a CBC as well as complete metabolic panel today.  I will see him back in my office in 2 months at that time if he is improving we will stop his  colchicine.   Medication Adjustments/Labs and Tests Ordered: Current medicines are reviewed at length with the patient today.  Concerns regarding medicines are outlined above.  No orders of the defined types were placed in this encounter.  Medication changes: No orders of the defined types were placed in this encounter.   Signed, Jeffrey Lea, MD, [facility] 02/16/2018 1:50 PM    [facility]

## 2018-02-16 NOTE — Patient Instructions (Signed)
Medication Instructions:  Your physician recommends that you continue on your current medications as directed. Please refer to the Current Medication list given to you today.  If you need a refill on your cardiac medications before your next appointment, please call your pharmacy.   Lab work: Your physician recommends that you have lab work today: CBC and CMP  If you have labs (blood work) drawn today and your tests are completely normal, you will receive your results only by: Marland Kitchen MyChart Message (if you have MyChart) OR . A paper copy in the mail If you have any lab test that is abnormal or we need to change your treatment, we will call you to review the results.  Testing/Procedures: None ordered  Follow-Up: At [facility], you and your health needs are our priority.  As part of our continuing mission to provide you with exceptional heart care, we have created designated Provider Care Teams.  These Care Teams include your primary Cardiologist (physician) and Advanced Practice Providers (APPs -  Physician Assistants and Nurse Practitioners) who all work together to provide you with the care you need, when you need it. You will need a follow up appointment in 2 months.  You may see Gypsy Balsam, MD or another member of our [facility] Provider Team in [place]: Norman Herrlich, MD . Belva Crome, MD  Any Other Special Instructions Will Be Listed Below (If Applicable).

## 2018-02-17 LAB — COMPREHENSIVE METABOLIC PANEL
A/G RATIO: 2.6 — AB (ref 1.2–2.2)
ALBUMIN: 4.9 g/dL (ref 3.5–5.5)
ALT: 14 IU/L (ref 0–44)
AST: 18 IU/L (ref 0–40)
Alkaline Phosphatase: 66 IU/L (ref 39–117)
BUN / CREAT RATIO: 10 (ref 9–20)
BUN: 11 mg/dL (ref 6–20)
Bilirubin Total: 1.7 mg/dL — ABNORMAL HIGH (ref 0.0–1.2)
CALCIUM: 9.5 mg/dL (ref 8.7–10.2)
CO2: 21 mmol/L (ref 20–29)
CREATININE: 1.11 mg/dL (ref 0.76–1.27)
Chloride: 102 mmol/L (ref 96–106)
GFR calc Af Amer: 100 mL/min/{1.73_m2} (ref >59)
GFR, EST NON AFRICAN AMERICAN: 87 mL/min/{1.73_m2} (ref >59)
GLOBULIN, TOTAL: 1.9 g/dL (ref 1.5–4.5)
Glucose: 85 mg/dL (ref 65–99)
POTASSIUM: 4.3 mmol/L (ref 3.5–5.2)
SODIUM: 138 mmol/L (ref 134–144)
Total Protein: 6.8 g/dL (ref 6.0–8.5)

## 2018-02-17 LAB — CBC
HEMATOCRIT: 45.5 % (ref 37.5–51.0)
HEMOGLOBIN: 15.4 g/dL (ref 13.0–17.7)
MCH: 30.6 pg (ref 26.6–33.0)
MCHC: 33.8 g/dL (ref 31.5–35.7)
MCV: 90 fL (ref 79–97)
Platelets: 266 10*3/uL (ref 150–450)
RBC: 5.04 x10E6/uL (ref 4.14–5.80)
RDW: 12.3 % (ref 11.6–15.4)
WBC: 6.1 10*3/uL (ref 3.4–10.8)

## 2018-04-24 ENCOUNTER — Telehealth: Payer: Self-pay | Admitting: Emergency Medicine

## 2018-04-24 ENCOUNTER — Ambulatory Visit: Payer: 59 | Admitting: Cardiology

## 2018-04-24 NOTE — Telephone Encounter (Signed)
Called patient to inquire about rescheduling appointment due to COVID 19 pandemic per Dr. Bing Matter. I asked the patient how he was doing he reports he had some flare ups this weekend with chest pressure after digging a whole for his dog that passed away other than that he had no concerns. He reports this was nothing new and not concerning because it was the same pain he had already felt previously. Dr. Bing Matter aware. We advised him to only exert himself gradually. His appointment was rescheduled he agreed to do this and was informed to call us with any concerns in between. He verbally understood.

## 2018-05-16 ENCOUNTER — Telehealth: Payer: Self-pay | Admitting: Cardiology

## 2018-05-16 NOTE — Telephone Encounter (Signed)
Cardiac Questionnaire:    Since your last visit or hospitalization:    1. Have you been having new or worsening chest pain? no   2. Have you been having new or worsening shortness of breath? no 3. Have you been having new or worsening leg swelling, wt gain, or increase in abdominal girth (pants fitting more tightly)? no   4. Have you had any passing out spells? no    *A YES to any of these questions would result in the appointment being kept. *If all the answers to these questions are NO, we should indicate that given the current situation regarding the worldwide coronarvirus pandemic, at the recommendation of the CDC, we are looking to limit gatherings in our waiting area, and thus will reschedule their appointment beyond four weeks from today.   _____________   COVID-19 Pre-Screening Questions:   Do you currently have a fever? no (yes = cancel and refer to pcp for e-visit)  Have you recently travelled on a cruise, internationally, or to [place], [place], [place], [place], [place], or [place], [place]) ? no (yes = cancel, stay home, monitor symptoms, and contact pcp or initiate e-visit if symptoms develop)  Have you been in contact with someone that is currently pending confirmation of Covid19 testing or has been confirmed to have the Covid19 virus?  no (yes = cancel, stay home, away from tested individual, monitor symptoms, and contact pcp or initiate e-visit if symptoms develop)  Are you currently experiencing fatigue or cough? no (yes = pt should be prepared to have a mask placed at the time of their visit).         Virtual Visit Pre-Appointment Phone Call  Steps For Call:  1. Confirm consent - "In the setting of the current Covid19 crisis, you are scheduled for a (phone or video) visit with your provider on (date) at (time).  Just as we do with many in-office visits, in order for you to participate in this visit, we must obtain consent.  If you'd like, I can send this to your mychart (if  signed up) or email for you to review.  Otherwise, I can obtain your verbal consent now.  All virtual visits are billed to your insurance company just like a normal visit would be.  By agreeing to a virtual visit, we'd like you to understand that the technology does not allow for your provider to perform an examination, and thus may limit your provider's ability to fully assess your condition.  Finally, though the technology is pretty good, we cannot assure that it will always work on either your or our end, and in the setting of a video visit, we may have to convert it to a phone-only visit.  In either situation, we cannot ensure that we have a secure connection.  Are you willing to proceed?"  2. Give patient instructions for WebEx download to smartphone as below if video visit  3. Advise patient to be prepared with any vital sign or heart rhythm information, their current medicines, and a piece of paper and pen handy for any instructions they may receive the day of their visit  4. Inform patient they will receive a phone call 15 minutes prior to their appointment time (may be from unknown caller ID) so they should be prepared to answer  5. Confirm that appointment type is correct in Epic appointment notes (video vs telephone)    TELEPHONE CALL NOTE  Jeffrey L Reames Jr. has been deemed a candidate  for a follow-up tele-health visit to limit community exposure during the Covid-19 pandemic. I spoke with the patient via phone to ensure availability of phone/video source, confirm preferred email & phone number, and discuss instructions and expectations.  I reminded Jeffrey L Offner Jr. to be prepared with any vital sign and/or heart rhythm information that could potentially be obtained via home monitoring, at the time of his visit. I reminded Jeffrey L Hakim Jr. to expect a phone call at the time of his visit if his visit.  Did the patient verbally acknowledge consent to treatment?  YES/verbal  Jeffrey Wells 05/16/2018 10:45 AM   DOWNLOADING THE WEBEX SOFTWARE TO SMARTPHONE  - If Apple, go to [employer] Store and type in WebEx in the search bar. Download [employer] Meetings, the blue/green circle. The app is free but as with any other app downloads, their phone may require them to verify saved payment information or Apple password. The patient does NOT have to create an account.  - If Android, ask patient to go to [employer] Play Store and type in WebEx in the search bar. Download [employer] Meetings, the blue/green circle. The app is free but as with any other app downloads, their phone may require them to verify saved payment information or Android password. The patient does NOT have to create an account.   CONSENT FOR TELE-HEALTH VISIT - PLEASE REVIEW  I hereby voluntarily request, consent and authorize [facility] and its employed or contracted physicians, physician assistants, nurse practitioners or other licensed health care professionals (the Practitioner), to provide me with telemedicine health care services (the Services") as deemed necessary by the treating Practitioner. I acknowledge and consent to receive the Services by the Practitioner via telemedicine. I understand that the telemedicine visit will involve communicating with the Practitioner through live audiovisual communication technology and the disclosure of certain medical information by electronic transmission. I acknowledge that I have been given the opportunity to request an in-person assessment or other available alternative prior to the telemedicine visit and am voluntarily participating in the telemedicine visit.  I understand that I have the right to withhold or withdraw my consent to the use of telemedicine in the course of my care at any time, without affecting my right to future care or treatment, and that the Practitioner or I may terminate the telemedicine visit at any time. I understand that I  have the right to inspect all information obtained and/or recorded in the course of the telemedicine visit and may receive copies of available information for a reasonable fee.  I understand that some of the potential risks of receiving the Services via telemedicine include:   Delay or interruption in medical evaluation due to technological equipment failure or disruption;  Information transmitted may not be sufficient (e.g. poor resolution of images) to allow for appropriate medical decision making by the Practitioner; and/or   In rare instances, security protocols could fail, causing a breach of personal health information.  Furthermore, I acknowledge that it is my responsibility to provide information about my medical history, conditions and care that is complete and accurate to the best of my ability. I acknowledge that Practitioner's advice, recommendations, and/or decision may be based on factors not within their control, such as incomplete or inaccurate data provided by me or distortions of diagnostic images or specimens that may result from electronic transmissions. I understand that the practice of medicine is not an exact science and that Practitioner makes no warranties or guarantees regarding  treatment outcomes. I acknowledge that I will receive a copy of this consent concurrently upon execution via email to the email address I last provided but may also request a printed copy by calling the office of [facility].    I understand that my insurance will be billed for this visit.   I have read or had this consent read to me.  I understand the contents of this consent, which adequately explains the benefits and risks of the Services being provided via telemedicine.   I have been provided ample opportunity to ask questions regarding this consent and the Services and have had my questions answered to my satisfaction.  I give my informed consent for the services to be provided through the  use of telemedicine in my medical care  By participating in this telemedicine visit I agree to the above.

## 2018-05-22 ENCOUNTER — Encounter: Payer: Self-pay | Admitting: Cardiology

## 2018-05-22 ENCOUNTER — Telehealth (INDEPENDENT_AMBULATORY_CARE_PROVIDER_SITE_OTHER): Payer: 59 | Admitting: Cardiology

## 2018-05-22 ENCOUNTER — Other Ambulatory Visit: Payer: Self-pay

## 2018-05-22 VITALS — Wt 150.0 lb

## 2018-05-22 DIAGNOSIS — I319 Disease of pericardium, unspecified: Secondary | ICD-10-CM | POA: Diagnosis not present

## 2018-05-22 DIAGNOSIS — R072 Precordial pain: Secondary | ICD-10-CM

## 2018-05-22 NOTE — Patient Instructions (Signed)
Medication Instructions:  Your physician recommends that you continue on your current medications as directed. Please refer to the Current Medication list given to you today.  If you need a refill on your cardiac medications before your next appointment, please call your pharmacy.   Lab work: Your physician recommends that you return for lab work when you come to have echo: fasting lipid profile  If you have labs (blood work) drawn today and your tests are completely normal, you will receive your results only by: Marland Kitchen MyChart Message (if you have MyChart) OR . A paper copy in the mail If you have any lab test that is abnormal or we need to change your treatment, we will call you to review the results.  Testing/Procedures: Your physician has requested that you have an echocardiogram. Echocardiography is a painless test that uses sound waves to create images of your heart. It provides your doctor with information about the size and shape of your heart and how well your heart's chambers and valves are working. This procedure takes approximately one hour. There are no restrictions for this procedure.    Follow-Up: At [facility], you and your health needs are our priority.  As part of our continuing mission to provide you with exceptional heart care, we have created designated Provider Care Teams.  These Care Teams include your primary Cardiologist (physician) and Advanced Practice Providers (APPs -  Physician Assistants and Nurse Practitioners) who all work together to provide you with the care you need, when you need it. You will need a follow up appointment in 3 months.  Please call our office 2 months in advance to schedule this appointment.  You may see Gypsy Balsam, MD or another member of our [facility] HeartCare Provider Team in [place]: Norman Herrlich, MD . Belva Crome, MD  Any Other Special Instructions Will Be Listed Below (If Applicable).   Echocardiogram An echocardiogram is a  procedure that uses painless sound waves (ultrasound) to produce an image of the heart. Images from an echocardiogram can provide important information about:  Signs of coronary artery disease (CAD).  Aneurysm detection. An aneurysm is a weak or damaged part of an artery wall that bulges out from the normal force of blood pumping through the body.  Heart size and shape. Changes in the size or shape of the heart can be associated with certain conditions, including heart failure, aneurysm, and CAD.  Heart muscle function.  Heart valve function.  Signs of a past heart attack.  Fluid buildup around the heart.  Thickening of the heart muscle.  A tumor or infectious growth around the heart valves. Tell a health care provider about:  Any allergies you have.  All medicines you are taking, including vitamins, herbs, eye drops, creams, and over-the-counter medicines.  Any blood disorders you have.  Any surgeries you have had.  Any medical conditions you have.  Whether you are pregnant or may be pregnant. What are the risks? Generally, this is a safe procedure. However, problems may occur, including:  Allergic reaction to dye (contrast) that may be used during the procedure. What happens before the procedure? No specific preparation is needed. You may eat and drink normally. What happens during the procedure?   An IV tube may be inserted into one of your veins.  You may receive contrast through this tube. A contrast is an injection that improves the quality of the pictures from your heart.  A gel will be applied to your chest.  A  wand-like tool (transducer) will be moved over your chest. The gel will help to transmit the sound waves from the transducer.  The sound waves will harmlessly bounce off of your heart to allow the heart images to be captured in real-time motion. The images will be recorded on a computer. The procedure may vary among health care providers and hospitals.  What happens after the procedure?  You may return to your normal, everyday life, including diet, activities, and medicines, unless your health care provider tells you not to do that. Summary  An echocardiogram is a procedure that uses painless sound waves (ultrasound) to produce an image of the heart.  Images from an echocardiogram can provide important information about the size and shape of your heart, heart muscle function, heart valve function, and fluid buildup around your heart.  You do not need to do anything to prepare before this procedure. You may eat and drink normally.  After the echocardiogram is completed, you may return to your normal, everyday life, unless your health care provider tells you not to do that. This information is not intended to replace advice given to you by your health care provider. Make sure you discuss any questions you have with your health care provider. Document Released: 01/22/2000 Document Revised: 02/27/2016 Document Reviewed: 02/27/2016 Elsevier Interactive Patient Education  2019 [employer].

## 2018-05-22 NOTE — Progress Notes (Signed)
Virtual Visit via Video Note   This visit type was conducted due to national recommendations for restrictions regarding the COVID-19 Pandemic (e.g. social distancing) in an effort to limit this patient's exposure and mitigate transmission in our community.  Due to his co-morbid illnesses, this patient is at least at moderate risk for complications without adequate follow up.  This format is felt to be most appropriate for this patient at this time.  All issues noted in this document were discussed and addressed.  A limited physical exam was performed with this format.  Please refer to the patient's chart for his consent to telehealth for [facility].  Evaluation Performed:  Follow-up visit  This visit type was conducted due to national recommendations for restrictions regarding the COVID-19 Pandemic (e.g. social distancing).  This format is felt to be most appropriate for this patient at this time.  All issues noted in this document were discussed and addressed.  No physical exam was performed (except for noted visual exam findings with Video Visits).  Please refer to the patient's chart (MyChart message for video visits and phone note for telephone visits) for the patient's consent to telehealth for [facility].  Date:  05/22/2018  ID: Jeffrey Cap., DOB 1985-07-30, MRN [id number]   Patient Location:  [phone] [facility] [place] [address]   Provider location:   [facility] [place] [facility]  PCP:  Patient, No Pcp Per  Cardiologist:  Gypsy Balsam, MD     Chief Complaint: Doing better  History of Present Illness:    Jeffrey Debruhl. is a 33 y.o. male  who presents via audio/video conferencing for a telehealth visit today.  He does have history of idiopathic pericarditis.  He did have a lot of pain required eventually colchicine doing better from that point review denies having any chest pain tightness squeezing pressure burning chest still weak and tired however  when asking question how is he is ability to exercise compared to the way he was a year ago he told me about 75%.  Usually he does well sometimes he still gets some pressure in the chest not related to exercise.  Still work.  He ran out of colchicine about 2 weeks ago and still seems to be doing well.   The patient does not have symptoms concerning for COVID-19 infection (fever, chills, cough, or new SHORTNESS OF BREATH).    Prior CV studies:   The following studies were reviewed today:       No past medical history on file.  Past Surgical History:  Procedure Laterality Date  . KNEE SURGERY  2011  . SHOULDER SURGERY Left      Current Meds  Medication Sig  . traMADol (ULTRAM) 50 MG tablet Take 50 mg by mouth as needed.       Family History: The patient's family history includes Heart attack in his mother and paternal grandfather; Hypertension in his father.   ROS:   Please see the history of present illness.     All other systems reviewed and are negative.   Labs/Other Tests and Data Reviewed:     Recent Labs: 02/16/2018: ALT 14; BUN 11; Creatinine, Ser 1.11; Hemoglobin 15.4; Platelets 266; Potassium 4.3; Sodium 138  Recent Lipid Panel No results found for: CHOL, TRIG, HDL, CHOLHDL, VLDL, LDLCALC, LDLDIRECT    Exam:    Vital Signs:  Wt 150 lb (68 kg)   BMI 20.34 kg/m     Wt Readings  from Last 3 Encounters:  05/22/18 150 lb (68 kg)  02/16/18 148 lb (67.1 kg)  01/17/18 148 lb (67.1 kg)     Well nourished, well developed male in no acute distress. Alert awake in exam 3.  Talking to me using video link from his iPhone.  Does not have JVD no swelling of lower extremities.  Diagnosis for this visit:   1. Chronic idiopathic pericarditis, unspecified complication status   2. Precordial chest pain      ASSESSMENT & PLAN:    1.  Chronic idiopathic pericarditis.  Seems to be stable right now out of colchicine without taking any medications.  I will  schedule him to have another echocardiogram to assess thickness of the pericardium as well as presence of pericardial effusion.  I told him to let me know if he will have more chest pain. 2.  He does have significant family history of coronary artery disease and he would like to make sure that his risk factors are well modified.  I will ask him to have fasting lipid profile done.  COVID-19 Education: The signs and symptoms of COVID-19 were discussed with the patient and how to seek care for testing (follow up with PCP or arrange E-visit).  The importance of social distancing was discussed today.  Patient Risk:   After full review of this patients clinical status, I feel that they are at least moderate risk at this time.  Time:   Today, I have spent 21 minutes with the patient with telehealth technology discussing pt health issues. Visit was finished at 12:02 PM.    Medication Adjustments/Labs and Tests Ordered: Current medicines are reviewed at length with the patient today.  Concerns regarding medicines are outlined above.  No orders of the defined types were placed in this encounter.  Medication changes: No orders of the defined types were placed in this encounter.    Disposition: Follow-up 3 months, echocardiogram and fasting lipid profile will be done with acuity level 2  Signed, Georgeanna Leaobert J. Marland Reine, MD, [facility] 05/22/2018 12:03 PM    [facility]

## 2018-06-06 DIAGNOSIS — M545 Low back pain: Secondary | ICD-10-CM | POA: Diagnosis not present

## 2018-06-06 DIAGNOSIS — M47816 Spondylosis without myelopathy or radiculopathy, lumbar region: Secondary | ICD-10-CM | POA: Diagnosis not present

## 2018-07-12 ENCOUNTER — Other Ambulatory Visit: Payer: 59

## 2018-08-28 ENCOUNTER — Other Ambulatory Visit: Payer: Self-pay

## 2018-08-28 ENCOUNTER — Telehealth (INDEPENDENT_AMBULATORY_CARE_PROVIDER_SITE_OTHER): Payer: 59 | Admitting: Cardiology

## 2018-08-28 ENCOUNTER — Telehealth: Payer: Self-pay | Admitting: Emergency Medicine

## 2018-08-28 ENCOUNTER — Encounter: Payer: Self-pay | Admitting: Cardiology

## 2018-08-28 VITALS — Wt 150.0 lb

## 2018-08-28 DIAGNOSIS — I319 Disease of pericardium, unspecified: Secondary | ICD-10-CM

## 2018-08-28 DIAGNOSIS — R072 Precordial pain: Secondary | ICD-10-CM

## 2018-08-28 NOTE — Telephone Encounter (Signed)
Left message for patient to return call to go over discharge instructions from visit with Dr. Agustin Cree today.

## 2018-08-28 NOTE — Addendum Note (Signed)
Addended by: Ashok Norris on: 08/28/2018 12:25 PM   Modules accepted: Orders

## 2018-08-28 NOTE — Progress Notes (Signed)
Virtual Visit via Video Note   This visit type was conducted due to national recommendations for restrictions regarding the COVID-19 Pandemic (e.g. social distancing) in an effort to limit this patient's exposure and mitigate transmission in our community.  Due to his co-morbid illnesses, this patient is at least at moderate risk for complications without adequate follow up.  This format is felt to be most appropriate for this patient at this time.  All issues noted in this document were discussed and addressed.  A limited physical exam was performed with this format.  Please refer to the patient's chart for his consent to telehealth for [facility]  Evaluation Performed:  Follow-up visit  This visit type was conducted due to national recommendations for restrictions regarding the COVID-19 Pandemic (e.g. social distancing).  This format is felt to be most appropriate for this patient at this time.  All issues noted in this document were discussed and addressed.  No physical exam was performed (except for noted visual exam findings with Video Visits).  Please refer to the patient's chart (MyChart message for video visits and phone note for telephone visits) for the patient's consent to telehealth for [facility].  Date:  08/28/2018  ID: Jeffrey Rude., DOB February 25, 1985, MRN [id number]   Patient Location: [place] [address]   Provider location:   [place] Office  PCP:  Patient, No Pcp Per  Cardiologist:  Jenne Campus, MD     Chief Complaint: Doing fine  History of Present Illness:    Jeffrey Staubs. is a 33 y.o. male  who presents via audio/video conferencing for a telehealth visit today.  History of pericarditis successfully managed with colchicine.  He is having tele-visit with me today just to check on his condition overall he is doing fine but described to have some exertional shortness of breath as well as some chest pain which  seems to be atypical not related to exertion.  He described a situation when he went to the [facility] he have to carry some heavy equipment when he did this he became significantly short of breath also started having some chest pain.  Other than that activity of daily living he is doing well.  He supposed to have echocardiogram but it was not done he did not showed up for the test.   The patient does not have symptoms concerning for COVID-19 infection (fever, chills, cough, or new SHORTNESS OF BREATH).    Prior CV studies:   The following studies were reviewed today:       No past medical history on file.  Past Surgical History:  Procedure Laterality Date  . KNEE SURGERY  2011  . SHOULDER SURGERY Left      Current Meds  Medication Sig  . traMADol (ULTRAM) 50 MG tablet Take 50 mg by mouth as needed.       Family History: The patient's family history includes Heart attack in his mother and paternal grandfather; Hypertension in his father.   ROS:   Please see the history of present illness.     All other systems reviewed and are negative.   Labs/Other Tests and Data Reviewed:     Recent Labs: 02/16/2018: ALT 14; BUN 11; Creatinine, Ser 1.11; Hemoglobin 15.4; Platelets 266; Potassium 4.3; Sodium 138  Recent Lipid Panel No results found for: CHOL, TRIG, HDL, CHOLHDL, VLDL, LDLCALC, LDLDIRECT    Exam:    Vital Signs:  Wt 150 lb (  68 kg)   BMI 20.34 kg/m     Wt Readings from Last 3 Encounters:  08/28/18 150 lb (68 kg)  05/22/18 150 lb (68 kg)  02/16/18 148 lb (67.1 kg)     Well nourished, well developed in no acute distress. Alert awake in exam 3 with talking over the video link.  He is in his car he got to pull over to be able to talk to me.  Not in any distress.  Diagnosis for this visit:   1. Chronic idiopathic pericarditis, unspecified complication status   2. Precordial chest pain      ASSESSMENT & PLAN:    1.  History of chronic idiopathic  pericarditis.  Still have some flares up from time to time usually lasting for day or 2 with some pain does not take any medication for it.  Concern is the fact that he does have some exertional shortness of breath.  I will reschedule him to have echocardiogram done. Precordial chest pain: Atypical however upon evaluation he will have an echocardiogram done as well as fasting lipid profile.  COVID-19 Education: The signs and symptoms of COVID-19 were discussed with the patient and how to seek care for testing (follow up with PCP or arrange E-visit).  The importance of social distancing was discussed today.  Patient Risk:   After full review of this patients clinical status, I feel that they are at least moderate risk at this time.  Time:   Today, I have spent 18 minutes with the patient with telehealth technology discussing pt health issues.  I spent 5 minutes reviewing her chart before the visit.  Visit was finished at 11:45 AM.    Medication Adjustments/Labs and Tests Ordered: Current medicines are reviewed at length with the patient today.  Concerns regarding medicines are outlined above.  No orders of the defined types were placed in this encounter.  Medication changes: No orders of the defined types were placed in this encounter.    Disposition: Follow-up 3 months  Signed, Jeffrey Leaobert J. Kayela Humphres, MD, [facility]ACC 08/28/2018 11:48 AM    [facility]

## 2018-08-28 NOTE — Patient Instructions (Signed)
Medication Instructions:  Your physician recommends that you continue on your current medications as directed. Please refer to the Current Medication list given to you today.  If you need a refill on your cardiac medications before your next appointment, please call your pharmacy.   Lab work: Your physician recommends that you return for lab work when you come for your echo: fasting lipids   If you have labs (blood work) drawn today and your tests are completely normal, you will receive your results only by: Marland Kitchen MyChart Message (if you have MyChart) OR . A paper copy in the mail If you have any lab test that is abnormal or we need to change your treatment, we will call you to review the results.  Testing/Procedures: Your physician has requested that you have an echocardiogram. Echocardiography is a painless test that uses sound waves to create images of your heart. It provides your doctor with information about the size and shape of your heart and how well your heart's chambers and valves are working. This procedure takes approximately one hour. There are no restrictions for this procedure.    Follow-Up: At [facility], you and your health needs are our priority.  As part of our continuing mission to provide you with exceptional heart care, we have created designated Provider Care Teams.  These Care Teams include your primary Cardiologist (physician) and Advanced Practice Providers (APPs -  Physician Assistants and Nurse Practitioners) who all work together to provide you with the care you need, when you need it. You will need a follow up appointment in 3 months.  Please call our office 2 months in advance to schedule this appointment.  You may see Jenne Campus, MD or another member of our [place] Provider Team in [place]: Shirlee More, MD . Jyl Heinz, MD  Any Other Special Instructions Will Be Listed Below (If Applicable).   Echocardiogram An echocardiogram is a  procedure that uses painless sound waves (ultrasound) to produce an image of the heart. Images from an echocardiogram can provide important information about:  Signs of coronary artery disease (CAD).  Aneurysm detection. An aneurysm is a weak or damaged part of an artery wall that bulges out from the normal force of blood pumping through the body.  Heart size and shape. Changes in the size or shape of the heart can be associated with certain conditions, including heart failure, aneurysm, and CAD.  Heart muscle function.  Heart valve function.  Signs of a past heart attack.  Fluid buildup around the heart.  Thickening of the heart muscle.  A tumor or infectious growth around the heart valves. Tell a health care provider about:  Any allergies you have.  All medicines you are taking, including vitamins, herbs, eye drops, creams, and over-the-counter medicines.  Any blood disorders you have.  Any surgeries you have had.  Any medical conditions you have.  Whether you are pregnant or may be pregnant. What are the risks? Generally, this is a safe procedure. However, problems may occur, including:  Allergic reaction to dye (contrast) that may be used during the procedure. What happens before the procedure? No specific preparation is needed. You may eat and drink normally. What happens during the procedure?   An IV tube may be inserted into one of your veins.  You may receive contrast through this tube. A contrast is an injection that improves the quality of the pictures from your heart.  A gel will be applied to your chest.  A  wand-like tool (transducer) will be moved over your chest. The gel will help to transmit the sound waves from the transducer.  The sound waves will harmlessly bounce off of your heart to allow the heart images to be captured in real-time motion. The images will be recorded on a computer. The procedure may vary among health care providers and hospitals.  What happens after the procedure?  You may return to your normal, everyday life, including diet, activities, and medicines, unless your health care provider tells you not to do that. Summary  An echocardiogram is a procedure that uses painless sound waves (ultrasound) to produce an image of the heart.  Images from an echocardiogram can provide important information about the size and shape of your heart, heart muscle function, heart valve function, and fluid buildup around your heart.  You do not need to do anything to prepare before this procedure. You may eat and drink normally.  After the echocardiogram is completed, you may return to your normal, everyday life, unless your health care provider tells you not to do that. This information is not intended to replace advice given to you by your health care provider. Make sure you discuss any questions you have with your health care provider. Document Released: 01/22/2000 Document Revised: 05/17/2018 Document Reviewed: 02/27/2016 Elsevier Patient Education  2020 [employer]

## 2018-10-12 ENCOUNTER — Other Ambulatory Visit: Payer: 59

## 2018-10-22 ENCOUNTER — Telehealth: Payer: Self-pay | Admitting: Cardiology

## 2018-10-22 DIAGNOSIS — R072 Precordial pain: Secondary | ICD-10-CM

## 2018-10-22 NOTE — Telephone Encounter (Signed)
Patient is having some chest tightness and some chest pain. Please advise.

## 2018-10-22 NOTE — Telephone Encounter (Signed)
Patient has had a few flair ups with the chest pain and tightness in the past few weeks. He did have noticeable shortness of breath last night. He also states that he did reschedule his Echo.

## 2018-10-22 NOTE — Telephone Encounter (Signed)
Check his sed rate and HS CRP

## 2018-10-26 NOTE — Telephone Encounter (Signed)
Patient advised that Dr. Agustin Cree wanted him to have labs. He will go to [place] for his labs

## 2018-11-06 LAB — HIGH SENSITIVITY CRP: CRP, High Sensitivity: 0.89 mg/L (ref 0.00–3.00)

## 2018-11-06 LAB — SEDIMENTATION RATE: Sed Rate: 4 mm/hr (ref 0–15)

## 2018-11-21 ENCOUNTER — Ambulatory Visit (INDEPENDENT_AMBULATORY_CARE_PROVIDER_SITE_OTHER): Payer: 59

## 2018-11-21 ENCOUNTER — Other Ambulatory Visit: Payer: Self-pay

## 2018-11-21 DIAGNOSIS — I319 Disease of pericardium, unspecified: Secondary | ICD-10-CM | POA: Diagnosis not present

## 2018-11-21 DIAGNOSIS — R072 Precordial pain: Secondary | ICD-10-CM | POA: Diagnosis not present

## 2018-11-21 NOTE — Progress Notes (Signed)
Complete echocardiogram has been performed.  Jimmy Purity Irmen RDCS, RVT 

## 2018-11-22 ENCOUNTER — Telehealth: Payer: Self-pay | Admitting: Cardiology

## 2018-11-22 NOTE — Telephone Encounter (Signed)
Patient informed of results.  

## 2018-11-22 NOTE — Telephone Encounter (Signed)
Please call patient with results of echo.  °

## 2019-01-28 ENCOUNTER — Telehealth: Payer: Self-pay | Admitting: Cardiology

## 2019-01-28 NOTE — Telephone Encounter (Signed)
Called patient. He reports he is having the same chest pressure as before, it comes on mainly at night and on heavy exertion. It was doing fine for a while but has flared up again. He has some shortness of breath with the pressure. His back doctor will refill tramadol for him. He just wanted doctor Dr. Agustin Cree to be ware that the chest pressure is back.

## 2019-01-28 NOTE — Telephone Encounter (Signed)
Called patient. Informed him to start motrin 200 mg three times daily on a full stomach for a week to see if that helps. Patient verbally understood. No further questions.

## 2019-01-28 NOTE — Telephone Encounter (Signed)
Start taking Motrin 200 mg 3 times daily on full stomach.  Lets wait for about a week and see if situation will improve

## 2019-01-28 NOTE — Telephone Encounter (Signed)
Please call patient he is having some cheat pressure and requested something for pain or sleep, he states that he usually takes Tramadol and wanted [place] to call in but I told him that Dr Agustin Cree did  not call inpain pills  and he states that he got from his back dr and I suggested that he call him for the refills of the Tramadol. Patient agrred to that.

## 2019-01-29 DIAGNOSIS — Z8679 Personal history of other diseases of the circulatory system: Secondary | ICD-10-CM | POA: Insufficient documentation

## 2019-02-22 ENCOUNTER — Other Ambulatory Visit: Payer: Self-pay

## 2019-02-22 ENCOUNTER — Ambulatory Visit (INDEPENDENT_AMBULATORY_CARE_PROVIDER_SITE_OTHER): Payer: BC Managed Care – PPO | Admitting: Cardiology

## 2019-02-22 ENCOUNTER — Encounter: Payer: Self-pay | Admitting: Cardiology

## 2019-02-22 VITALS — BP 120/78 | HR 78 | Ht 72.0 in | Wt 144.0 lb

## 2019-02-22 DIAGNOSIS — I319 Disease of pericardium, unspecified: Secondary | ICD-10-CM | POA: Diagnosis not present

## 2019-02-22 DIAGNOSIS — R072 Precordial pain: Secondary | ICD-10-CM | POA: Diagnosis not present

## 2019-02-22 MED ORDER — COLCHICINE 0.6 MG PO TABS: 0.6000 mg | ORAL_TABLET | Freq: Every day | ORAL | 1 refills | Status: AC

## 2019-02-22 NOTE — Patient Instructions (Signed)
Medication Instructions:  Your physician has recommended you make the following change in your medication:  START: colchicine 0.6 mg daily   *If you need a refill on your cardiac medications before your next appointment, please call your pharmacy*  Lab Work: Your physician recommends that you return for lab work today: sed rate, c reactive protein  If you have labs (blood work) drawn today and your tests are completely normal, you will receive your results only by: Marland Kitchen MyChart Message (if you have MyChart) OR . A paper copy in the mail If you have any lab test that is abnormal or we need to change your treatment, we will call you to review the results.  Testing/Procedures: None.   Follow-Up: At [facility], you and your health needs are our priority.  As part of our continuing mission to provide you with exceptional heart care, we have created designated Provider Care Teams.  These Care Teams include your primary Cardiologist (physician) and Advanced Practice Providers (APPs -  Physician Assistants and Nurse Practitioners) who all work together to provide you with the care you need, when you need it.  Your next appointment:   1 month(s)  The format for your next appointment:   In Person  Provider:   Gypsy Balsam, MD  Other Instructions  Colchicine tablets or capsules O que  este medicamento? A COLCHICINA  usada para prevenir ou tratar as crises agudas de gota ou artrite gotosa. Este medicamento tambm  usado para tratar a Film/video editor. Este medicamento pode ser usado para outros propsitos; em caso de dvidas, pergunte ao seu profissional de sade ou farmacutico. NOMES DE MARCAS COMUNS: Colcrys, MITIGARE O que devo dizer a meu profissional de sade antes de tomar este medicamento? Precisam saber se voc tem algum dos seguintes problemas ou estados de sade:  doenas renais  doenas hepticas  reao estranha ou alergia  colchicina  reao  estranha ou alergia a outros medicamentos  reao estranha ou alergia a alimentos, corantes ou conservantes  est grvida ou tentando engravidar  est [employer] devo usar este medicamento? [employer] medicamento por via oral com um copo cheio d'gua. Siga as instrues na embalagem ou na bula. Voc pode tomar este medicamento com ou sem comida. Se este medicamento lhe fizer mal ao estmago, tome-o com comida. Tome este medicamento em intervalos regulares. No tome este medicamento com frequncia maior do que a indicada. O farmacutico lhe dar um folheto informativo especial a cada compra do medicamento. No se esquea de ler atentamente essas informaes todas as vezes. Fale com seu pediatra a respeito do uso deste medicamento em crianas. Embora este medicamento possa ser receitado para crianas a [employer] 4 anos de idade para certas condies, algumas precaues so necessrias. Pacientes acima de 65 anos podem ter uma reao mais forte ao medicamento e precisar tomar doses menores. Superdosagem: Se achar que tomou uma superdosagem deste medicamento, entre em contato imediatamente com o [facility] [place] [facility] ou v a [employer] [facility]. OBSERVAO: Este medicamento  s para voc. No compartilhe este medicamento com outras pessoas. E se eu deixar de tomar uma dose? Se perder uma dose, tome-a assim que possvel. Se j estiver quase na hora da sua prxima dose, tome somente essa dose. No tome o remdio em dobro, nem tome uma dose adicional. O que pode interagir com este medicamento? No tome este medicamento com nenhum dos seguintes:  alguns medicamentos antivirais contra a infeco pelo HIV ou hepatite [employer]  tambm pode interagir com os seguintes remdios:  alguns antibiticos, como eritromicina ou claritromicina  alguns medicamentos para hipertenso, doenas cardacas ou arritmia cardaca  alguns medicamentos que abaixam os nveis de colesterol, como  lovastatina, sinvastatina, atorvastatina  alguns medicamentos para infeces fngicas, como cetoconazol, itraconazol ou posaconazol  ciclosporina  grapefruit (toranja) ou suco de grapefruit Esta lista pode no descrever todas as interaes possveis. D ao seu profissional de sade uma lista de todos os medicamentos, ervas medicinais, remdios de venda livre, ou suplementos alimentares que voc [place]. Diga tambm se voc fuma, bebe, ou [place] drogas ilcitas. Alguns destes podem interagir com o seu medicamento. Ao que devo ficar atento quando estiver [employer] medicamento? Consulte seu mdico ou profissional de sade para realizar um acompanhamento regular Diplomatic Services operational officer. Avise seu mdico ou profissional de sade se os seus sintomas no melhorarem ou se piorarem. Voc deve consumir vitamina B12 em quantidade suficiente enquanto estiver [employer]. Converse com seu mdico ou profissional de sade a respeito dos alimentos que consome e das vitaminas que toma. Este medicamento pode aumentar o risco de hematomas e hemorragias. Ao notar qualquer sangramento fora do comum, entre em contato com seu profissional de sade imediatamente. Que efeitos colaterais posso sentir aps usar este medicamento? Efeitos colaterais que devem ser informados ao seu mdico ou profissional de sade o mais rpido possvel:  reaes alrgicas, como erupo na pele, coceira, urticria, ou inchao do rosto, dos lbios ou da lngua  diminuio na contagem de clulas sanguneas - este medicamento pode diminuir o nmero de glbulos brancos, glbulos vermelhos e plaquetas. Isso pode aumentar o risco de infeces e sangramentos.  dor, dormncia ou formigamento nas mos ou ps  diarreia grave  sinais e sintomas de infeco, tais como febre; calafrios; tosse; dor de garganta; dor ou dificuldade para urinar  sinais e sintomas de leso muscular, tais como urina escura; dificuldade para urinar ou alteraes na  quantidade de urina; fraqueza ou cansao fora do comum; dor muscular; dor nas costas  sangramentos ou hematomas fora do comum  fraqueza ou cansao fora do comum  vmitos Efeitos colaterais que normalmente no precisam de cuidados mdicos (avise ao mdico ou profissional de sade se persistirem ou forem incmodos):  leve diarreia  enjoo  dor de estmago Esta lista pode no descrever todos os efeitos colaterais possveis. Para mais orientaes sobre efeitos colaterais, consulte o seu mdico. Voc pode relatar a ocorrncia de efeitos colaterais  FDA pelo telefone [phone]. Onde devo guardar meu medicamento? [facility] fora do [employer]. Conservar em temperatura ambiente, entre 15 e 30 degreesC [phone] e 86 degreesF). Manter o recipiente bem fechado. Proteger Administrator, arts. Descartar qualquer medicamento no utilizado aps a data de validade impressa no rtulo ou embalagem. OBSERVAO: Este folheto  um resumo. Pode no cobrir todas as informaes possveis. Se tiver dvidas a respeito deste medicamento, fale com seu mdico, farmacutico ou profissional de sade.  2020 Elsevier/Gold Standard (2017-06-06 00:00:00)

## 2019-02-22 NOTE — Progress Notes (Signed)
Cardiology Office Note:    Date:  02/22/2019   ID:  Jeffrey Wells., DOB 1985-05-27, MRN [id number]  PCP:  Patient, No Pcp Per  Cardiologist:  Jenne Campus, MD    Referring MD: No ref. provider found   Chief Complaint  Patient presents with  . Follow-up  I have a chest pain again  History of Present Illness:    Jeffrey Wells. is a 34 y.o. male with diagnosed with pericarditis before.  Was treated initially for colchicine for few months got better AND colchicine was withdrawn but she is summer of this year he started experiencing chest pain again.  He complained of having chest pain right now this is a pleuritic sensation he said he leaned forward he feel that he can take a deep breath he feels this would also complain of having some shortness of breath.  Also complained of having poor ability to tolerate exercise.  No palpitations no dizziness no passing out   History reviewed. No pertinent past medical history.  Past Surgical History:  Procedure Laterality Date  . KNEE SURGERY  2011  . SHOULDER SURGERY Left     Current Medications: Current Meds  Medication Sig  . ibuprofen (ADVIL) 200 MG tablet Take 200 mg by mouth every 6 (six) hours as needed.  . traMADol (ULTRAM) 50 MG tablet Take 50 mg by mouth as needed.      Allergies:   Patient has no known allergies.   Social History   Socioeconomic History  . Marital status: Married    Spouse name: Not on file  . Number of children: Not on file  . Years of education: Not on file  . Highest education level: Not on file  Occupational History  . Not on file  Tobacco Use  . Smoking status: Former Research scientist (life sciences)  . Smokeless tobacco: Never Used  Substance and Sexual Activity  . Alcohol use: Yes    Comment: rare  . Drug use: Not Currently  . Sexual activity: Not on file  Other Topics Concern  . Not on file  Social History Narrative  . Not on file   Social Determinants of Health   Financial Resource Strain:   .  Difficulty of Paying Living Expenses: Not on file  Food Insecurity:   . Worried About Charity fundraiser in the Last Year: Not on file  . Ran Out of Food in the Last Year: Not on file  Transportation Needs:   . Lack of Transportation (Medical): Not on file  . Lack of Transportation (Non-Medical): Not on file  Physical Activity:   . Days of Exercise per Week: Not on file  . Minutes of Exercise per Session: Not on file  Stress:   . Feeling of Stress : Not on file  Social Connections:   . Frequency of Communication with Friends and Family: Not on file  . Frequency of Social Gatherings with Friends and Family: Not on file  . Attends Religious Services: Not on file  . Active Member of Clubs or Organizations: Not on file  . Attends Archivist Meetings: Not on file  . Marital Status: Not on file     Family History: The patient's family history includes Heart attack in his mother and paternal grandfather; Hypertension in his father. ROS:   Please see the history of present illness.    All 14 point review of systems negative except as described per history of present illness  EKGs/Labs/Other Studies Reviewed:  Recent Labs: No results found for requested labs within last 8760 hours.  Recent Lipid Panel No results found for: CHOL, TRIG, HDL, CHOLHDL, VLDL, LDLCALC, LDLDIRECT  Physical Exam:    VS:  BP 120/78   Pulse 78   Ht 6' (1.829 m)   Wt 144 lb (65.3 kg)   SpO2 98%   BMI 19.53 kg/m     Wt Readings from Last 3 Encounters:  02/22/19 144 lb (65.3 kg)  08/28/18 150 lb (68 kg)  05/22/18 150 lb (68 kg)     GEN:  Well nourished, well developed in no acute distress HEENT: Normal NECK: No JVD; No carotid bruits LYMPHATICS: No lymphadenopathy CARDIAC: RRR, no murmurs, no rubs, no gallops RESPIRATORY:  Clear to auscultation without rales, wheezing or rhonchi  ABDOMEN: Soft, non-tender, non-distended MUSCULOSKELETAL:  No edema; No deformity  SKIN: Warm and  dry LOWER EXTREMITIES: no swelling NEUROLOGIC:  Alert and oriented x 3 PSYCHIATRIC:  Normal affect   ASSESSMENT:    1. Chronic idiopathic pericarditis, unspecified complication status   2. Precordial chest pain    PLAN:    In order of problems listed above:  1. Chronic idiopathic pericarditis.  Will check sed rate today as well as high-sensitivity C-reactive protein.  Echocardiogram reviewed showing no evidence of thickening of the pericardium.  He does not have any signs or symptoms of tamponade.  I will reinitiate colchicine again. 2. Precordial chest pain presumably related to pericarditis.  However I will check sed rate as well as C-reactive protein to see how active the processes and if that is normal we may investigate different reasons for his pain.   Medication Adjustments/Labs and Tests Ordered: Current medicines are reviewed at length with the patient today.  Concerns regarding medicines are outlined above.  No orders of the defined types were placed in this encounter.  Medication changes: No orders of the defined types were placed in this encounter.   Signed, Georgeanna Lea, MD, [facility] 02/22/2019 11:28 AM    [facility]

## 2019-02-23 LAB — C-REACTIVE PROTEIN: CRP: <1 mg/L (ref 0–10)

## 2019-02-23 LAB — SEDIMENTATION RATE: Sed Rate: 6 mm/hr (ref 0–15)

## 2019-03-27 ENCOUNTER — Ambulatory Visit: Payer: BC Managed Care – PPO | Admitting: Cardiology

## 2019-10-20 ENCOUNTER — Encounter (HOSPITAL_COMMUNITY): Payer: Self-pay | Admitting: Emergency Medicine

## 2019-10-20 ENCOUNTER — Other Ambulatory Visit: Payer: Self-pay

## 2019-10-20 ENCOUNTER — Emergency Department (HOSPITAL_COMMUNITY): Payer: BC Managed Care – PPO

## 2019-10-20 ENCOUNTER — Emergency Department (HOSPITAL_COMMUNITY)
Admission: EM | Admit: 2019-10-20 | Discharge: 2019-10-21 | Disposition: A | Discharge status: Home / Self Care | Payer: BC Managed Care – PPO | Attending: Emergency Medicine | Admitting: Emergency Medicine

## 2019-10-20 DIAGNOSIS — R079 Chest pain, unspecified: Secondary | ICD-10-CM

## 2019-10-20 DIAGNOSIS — U071 COVID-19: Secondary | ICD-10-CM | POA: Diagnosis not present

## 2019-10-20 DIAGNOSIS — R531 Weakness: Secondary | ICD-10-CM | POA: Diagnosis not present

## 2019-10-20 DIAGNOSIS — Z79899 Other long term (current) drug therapy: Secondary | ICD-10-CM | POA: Diagnosis not present

## 2019-10-20 DIAGNOSIS — Z87891 Personal history of nicotine dependence: Secondary | ICD-10-CM | POA: Diagnosis not present

## 2019-10-20 DIAGNOSIS — R0789 Other chest pain: Secondary | ICD-10-CM | POA: Diagnosis present

## 2019-10-20 HISTORY — DX: COVID-19: U07.1

## 2019-10-20 LAB — CBC
HCT: 46.5 % (ref 39.0–52.0)
Hemoglobin: 15.6 g/dL (ref 13.0–17.0)
MCH: 30.1 pg (ref 26.0–34.0)
MCHC: 33.5 g/dL (ref 30.0–36.0)
MCV: 89.8 fL (ref 80.0–100.0)
Platelets: 307 10*3/uL (ref 150–400)
RBC: 5.18 MIL/uL (ref 4.22–5.81)
RDW: 11.1 % — ABNORMAL LOW (ref 11.5–15.5)
WBC: 5.2 10*3/uL (ref 4.0–10.5)
nRBC: 0 % (ref 0.0–0.2)

## 2019-10-20 LAB — BASIC METABOLIC PANEL
Anion gap: 8 (ref 5–15)
BUN: 12 mg/dL (ref 6–20)
CO2: 27 mmol/L (ref 22–32)
Calcium: 9.5 mg/dL (ref 8.9–10.3)
Chloride: 103 mmol/L (ref 98–111)
Creatinine, Ser: 0.98 mg/dL (ref 0.61–1.24)
GFR calc Af Amer: >60 mL/min (ref >60)
GFR calc non Af Amer: >60 mL/min (ref >60)
Glucose, Bld: 83 mg/dL (ref 70–99)
Potassium: 4.4 mmol/L (ref 3.5–5.1)
Sodium: 138 mmol/L (ref 135–145)

## 2019-10-20 LAB — TROPONIN I (HIGH SENSITIVITY)
Troponin I (High Sensitivity): <2 ng/L (ref <18)
Troponin I (High Sensitivity): 3 ng/L (ref <18)

## 2019-10-20 MED ORDER — ASPIRIN 81 MG PO CHEW: 324.0000 mg | CHEWABLE_TABLET | Freq: Once | ORAL | Status: AC

## 2019-10-20 MED ORDER — HYDROCODONE-ACETAMINOPHEN 5-325 MG PO TABS
1.0000 | ORAL_TABLET | Freq: Once | ORAL | Status: AC
  Administered 2019-10-20: 1 via ORAL
  Filled 2019-10-20: qty 1

## 2019-10-20 MED ORDER — ASPIRIN 81 MG PO CHEW
CHEWABLE_TABLET | ORAL | Status: AC
  Administered 2019-10-20: 324 mg via ORAL
  Filled 2019-10-20: qty 4

## 2019-10-20 NOTE — ED Triage Notes (Signed)
Tested COVID + on 8/31.  Reports pain to center of chest, SOB, R arm pain, and bilateral leg pain x 2 days.  States [facility] in [place] sent him here.

## 2019-10-21 ENCOUNTER — Emergency Department (HOSPITAL_COMMUNITY): Payer: BC Managed Care – PPO

## 2019-10-21 MED ORDER — IOHEXOL 350 MG/ML SOLN
80.0000 mL | Freq: Once | INTRAVENOUS | Status: AC | PRN
  Administered 2019-10-21: 80 mL via INTRAVENOUS

## 2019-10-21 NOTE — Discharge Instructions (Signed)
You have been evaluated at [facility] emergency department for your chest pain in the setting of infection with COVID-19.  The major concern from his visit raised for urgent care was for a blood clot in your lungs (pulmonary embolism) a CT scan of your chest did not show any evidence of this and an evaluation of your heart is grossly normal.  Your chest pain could be related to pericarditis again and would suggest that you reach out to and follow-up with your cardiologist for further recommendations.  I suggest that you take 400 mg of ibuprofen 3 times daily until follow-up or until symptoms completely resolved.

## 2019-10-21 NOTE — ED Provider Notes (Signed)
[facility] EMERGENCY DEPARTMENT Provider Note   CSN: [id number] Arrival date & time: 10/20/19  1239     History Chief Complaint  Patient presents with  . Chest Pain    Jeffrey Wells. is a 34 y.o. male with a past medical history of pericarditis presents the ED for intermittent chest pain.  Patient states that he has had mild flulike symptoms over the last 2 weeks, tested positive for Covid 2 weeks ago.  Over the last few days however he has developed a chest pain that he describes as a tightness over his sternum, this is associated with worsening shortness of breath.  He presented to urgent care for these complaints was noted to be tachycardic and given concern for pulmonary embolism he was sent to the ED for further evaluation  The history is provided by the patient.  Chest Pain Pain location:  Substernal area Pain quality: tightness   Pain radiates to:  Does not radiate Pain severity:  Moderate Onset quality:  Gradual Duration:  3 days Timing:  Intermittent Progression:  Worsening Chronicity:  New Context comment:  Known COVID-19 Associated symptoms: fatigue, shortness of breath and weakness   Associated symptoms: no abdominal pain, no cough, no dizziness, no fever, no headache, no palpitations and no vomiting        Past Medical History:  Diagnosis Date  . COVID-19     Patient Active Problem List   Diagnosis Date Noted  . History of pericarditis 01/29/2019  . Heart disease 02/02/2018  . Precordial chest pain 12/20/2017  . Pericarditis 12/20/2017    Past Surgical History:  Procedure Laterality Date  . KNEE SURGERY  2011  . SHOULDER SURGERY Left        Family History  Problem Relation Age of Onset  . Heart attack Mother   . Hypertension Father   . Heart attack Paternal Grandfather     Social History   Tobacco Use  . Smoking status: Former Games developer  . Smokeless tobacco: Never Used  Vaping Use  . Vaping Use: Former  Substance  Use Topics  . Alcohol use: Yes    Comment: rare  . Drug use: Not Currently    Home Medications Prior to Admission medications   Medication Sig Start Date End Date Taking? Authorizing Provider  acetaminophen (TYLENOL) 500 MG tablet Take 500-1,000 mg by mouth every 6 (six) hours as needed for headache.   Yes [provider]  traMADol (ULTRAM) 50 MG tablet Take 50 mg by mouth 2 (two) times daily as needed.  10/30/17  Yes [provider]  colchicine 0.6 MG tablet Take 1 tablet (0.6 mg total) by mouth daily. Patient not taking: Reported on 10/20/2019 02/22/19   Georgeanna Lea, MD    Allergies    Patient has no known allergies.  Review of Systems   Review of Systems  Constitutional: Positive for chills and fatigue. Negative for fever.  HENT: Negative for facial swelling and voice change.   Eyes: Negative for redness and visual disturbance.  Respiratory: Positive for shortness of breath. Negative for cough.   Cardiovascular: Positive for chest pain. Negative for palpitations.  Gastrointestinal: Negative for abdominal pain and vomiting.  Genitourinary: Negative for difficulty urinating and dysuria.  Musculoskeletal: Negative for gait problem and joint swelling.  Skin: Negative for rash and wound.  Neurological: Positive for weakness. Negative for dizziness and headaches.  Psychiatric/Behavioral: Negative for confusion and suicidal ideas.    Physical Exam Updated Vital Signs  BP 135/82   Pulse 81   Temp 98.4 F (36.9 C) (Oral)   Resp 10   SpO2 99%   Physical Exam Constitutional:      General: He is not in acute distress. HENT:     Head: Normocephalic and atraumatic.     Mouth/Throat:     Mouth: Mucous membranes are moist.     Pharynx: Oropharynx is clear.  Eyes:     General: No scleral icterus.    Pupils: Pupils are equal, round, and reactive to light.  Cardiovascular:     Rate and Rhythm: Normal rate and regular rhythm.     Pulses: Normal pulses.    Pulmonary:     Effort: Pulmonary effort is normal. No respiratory distress.  Abdominal:     General: There is no distension.     Tenderness: There is no abdominal tenderness.  Musculoskeletal:        General: No tenderness or deformity.     Cervical back: Normal range of motion and neck supple.     Right lower leg: No tenderness. No edema.     Left lower leg: No tenderness. No edema.  Neurological:     General: No focal deficit present.     Mental Status: He is alert and oriented to person, place, and time.  Psychiatric:        Mood and Affect: Mood normal.        Behavior: Behavior normal.     ED Results / Procedures / Treatments   Labs (all labs ordered are listed, but only abnormal results are displayed) Labs Reviewed  CBC - Abnormal; Notable for the following components:      Result Value   RDW 11.1 (*)    All other components within normal limits  BASIC METABOLIC PANEL  SEDIMENTATION RATE  C-REACTIVE PROTEIN  TROPONIN I (HIGH SENSITIVITY)  TROPONIN I (HIGH SENSITIVITY)    EKG EKG Interpretation  Date/Time:  Sunday October 20 2019 22:36:27 EDT Ventricular Rate:  68 PR Interval:    QRS Duration: 91 QT Interval:  413 QTC Calculation: 440 R Axis:   82 Text Interpretation: Sinus rhythm No acute changes Confirmed by Marily Memos [phone]) on 10/20/2019 10:46:53 PM   Radiology CT Angio Chest PE W and/or Wo Contrast  Result Date: 10/21/2019 CLINICAL DATA:  Worsening shortness of breath. History of COVID positive. EXAM: CT ANGIOGRAPHY CHEST WITH CONTRAST TECHNIQUE: Multidetector CT imaging of the chest was performed using the standard protocol during bolus administration of intravenous contrast. Multiplanar CT image reconstructions and MIPs were obtained to evaluate the vascular anatomy. CONTRAST:  36mL OMNIPAQUE IOHEXOL 350 MG/ML SOLN COMPARISON:  None. FINDINGS: Cardiovascular: Satisfactory opacification of the pulmonary arteries to the segmental level. No evidence  of pulmonary embolism. Normal heart size. No pericardial effusion. Mediastinum/Nodes: No enlarged mediastinal, hilar, or axillary lymph nodes. Thyroid gland, trachea, and esophagus demonstrate no significant findings. Lungs/Pleura: Very small multifocal areas of atelectasis and/or infiltrate are seen along the posterior aspect of the left lower lobe. There is no evidence of a pleural effusion or pneumothorax. Upper Abdomen: No acute abnormality. Musculoskeletal: No chest wall abnormality. No acute or significant osseous findings. Review of the MIP images confirms the above findings. IMPRESSION: 1. No CT evidence of pulmonary embolism. 2. Very small multifocal areas of left lower lobe atelectasis and/or infiltrate. Electronically Signed   By: Aram Candela M.D.   On: 10/21/2019 01:20   DG Chest Portable 1 View  Result Date: 10/20/2019 CLINICAL DATA:  Chest pain.  Recent COVID-19 positive EXAM: PORTABLE CHEST 1 VIEW COMPARISON:  December 18, 2017 FINDINGS: Lungs are clear. Heart size and pulmonary vascularity are normal. No adenopathy. No pneumothorax. No bone lesions. IMPRESSION: Lungs clear.  Cardiac silhouette normal. Electronically Signed   By: Bretta Bang III M.D.   On: 10/20/2019 14:29    Procedures Procedures (including critical care time)  Medications Ordered in ED Medications  aspirin chewable tablet 324 mg (324 mg Oral Given 10/20/19 2149)  HYDROcodone-acetaminophen (NORCO/VICODIN) 5-325 MG per tablet 1 tablet (1 tablet Oral Given 10/20/19 2218)  iohexol (OMNIPAQUE) 350 MG/ML injection 80 mL (80 mLs Intravenous Contrast Given 10/21/19 0051)    ED Course  I have reviewed the triage vital signs and the nursing notes.  Pertinent labs & imaging results that were available during my care of the patient were reviewed by me and considered in my medical decision making (see chart for details).    MDM Rules/Calculators/A&P                          EKG findings by my read: Compared to  prior: 02/22/2019.  Rate: 68 rhythm: sinus Axis: appropriate  PR: Appropriate QRS: 91 QTc: 440.  High voltage EKG with Osborne-like waves in lateral leads similar to prior, no frank signs suggestive of pericarditis nor evidence of ischemia or arrhythmia, nor any other pathologic findings concerning considering patient presentation. Findings discussed with attending who agrees.  Differential diagnosis considered: ACS, PE, pneumothorax, pneumonia, COVID-19, costochondritis, pericarditis, myocarditis  Patient is relatively young and healthy 34 year old male complaining of nonexertional chest pain in the setting of known COVID-19.  Of note he does have a history of pericarditis but states that this pain is somewhat different in his location as well as its quality.  He was tachycardic to 130s apparently at urgent care which is why he was sent here for pulmonary embolism rule out, was mildly tachycardic in triage to 108  Patient does state that he has a family history of blood clots I find reasonable to perform CT PE study to rule out pulmonary embolism.  Chest x-ray from triage unremarkable, initial troponin negative, CBC and chemistries also unremarkable will obtain repeat troponin  Repeat troponin negative at 3, EKG as above reassuring, CT PE study negative for pulmonary pathology and certainly for pulmonary embolism  At this time I feel patient is stable for discharge, chest pain could be related to post Covid chondritis versus pericarditis, advised on scheduled ibuprofen and follow-up with cardiology, patient expressed understanding and agreement.  Labs reviewed and interpreted by myself with significant findings above. Imaging reviewed by myself and interpreted by radiologist.  Case and plan above discussed with my attending Dr. Clayborne Dana  Final Clinical Impression(s) / ED Diagnoses Final diagnoses:  Chest pain, unspecified type  COVID-19    Rx / DC Orders ED Discharge Orders    None      Labs, studies and imaging reviewed by myself and considered in medical decision making if ordered. Imaging interpreted by radiology. Pt was discussed with my attending, Dr. Clayborne Dana  Electronically signed by:  Christiane Ha Redding9/13/20211:34 AM       Loree Fee, MD 10/21/19 7846    Marily Memos, MD 10/21/19 2322

## 2020-07-20 IMAGING — DX DG CHEST 2V
2 series · 2 of 2 positions shown · non-contrast
Comparison: Radiographs October 29, 2009.

CLINICAL DATA: Chest pain.

EXAM:
CHEST - 2 VIEW

[w chest pa]
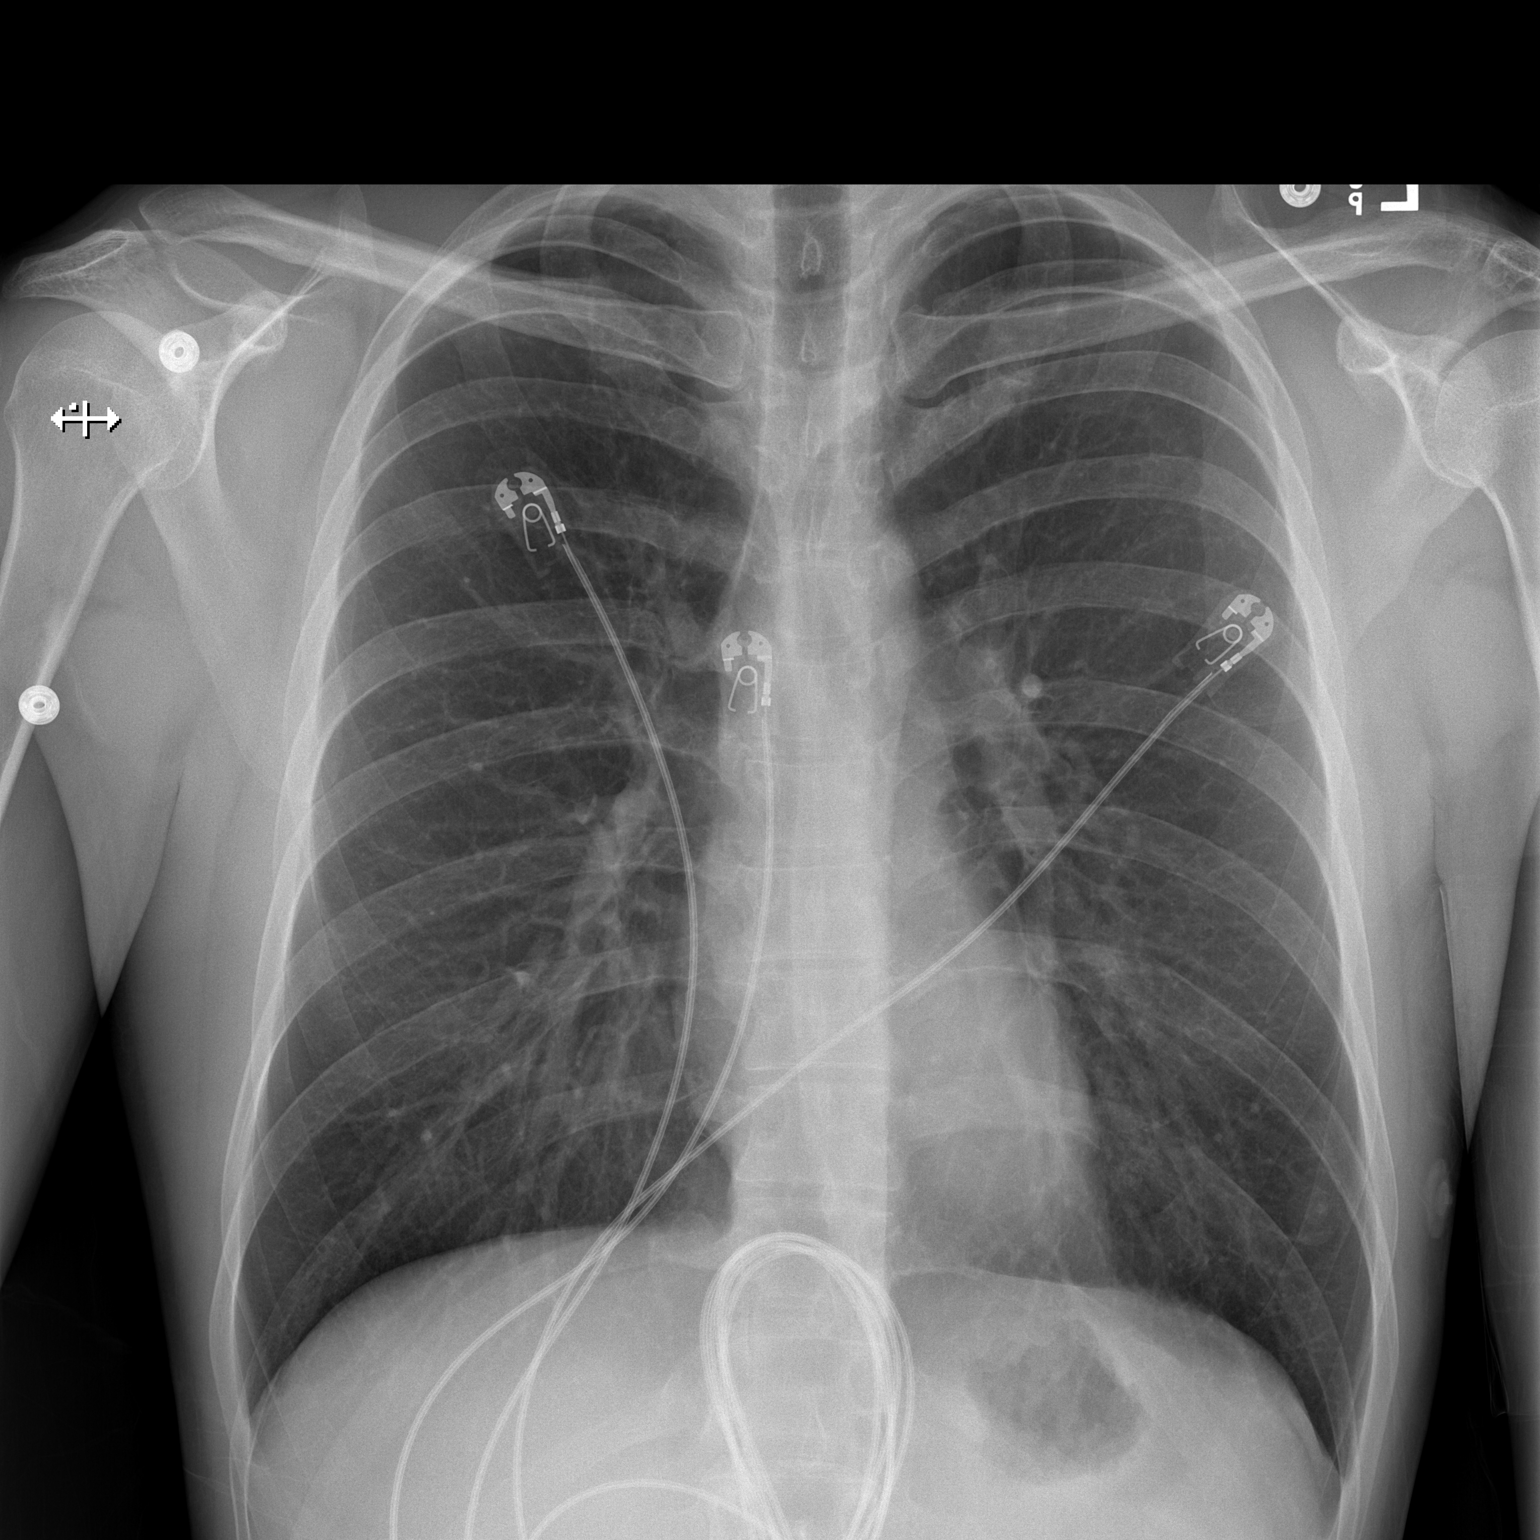

[w chest lat]
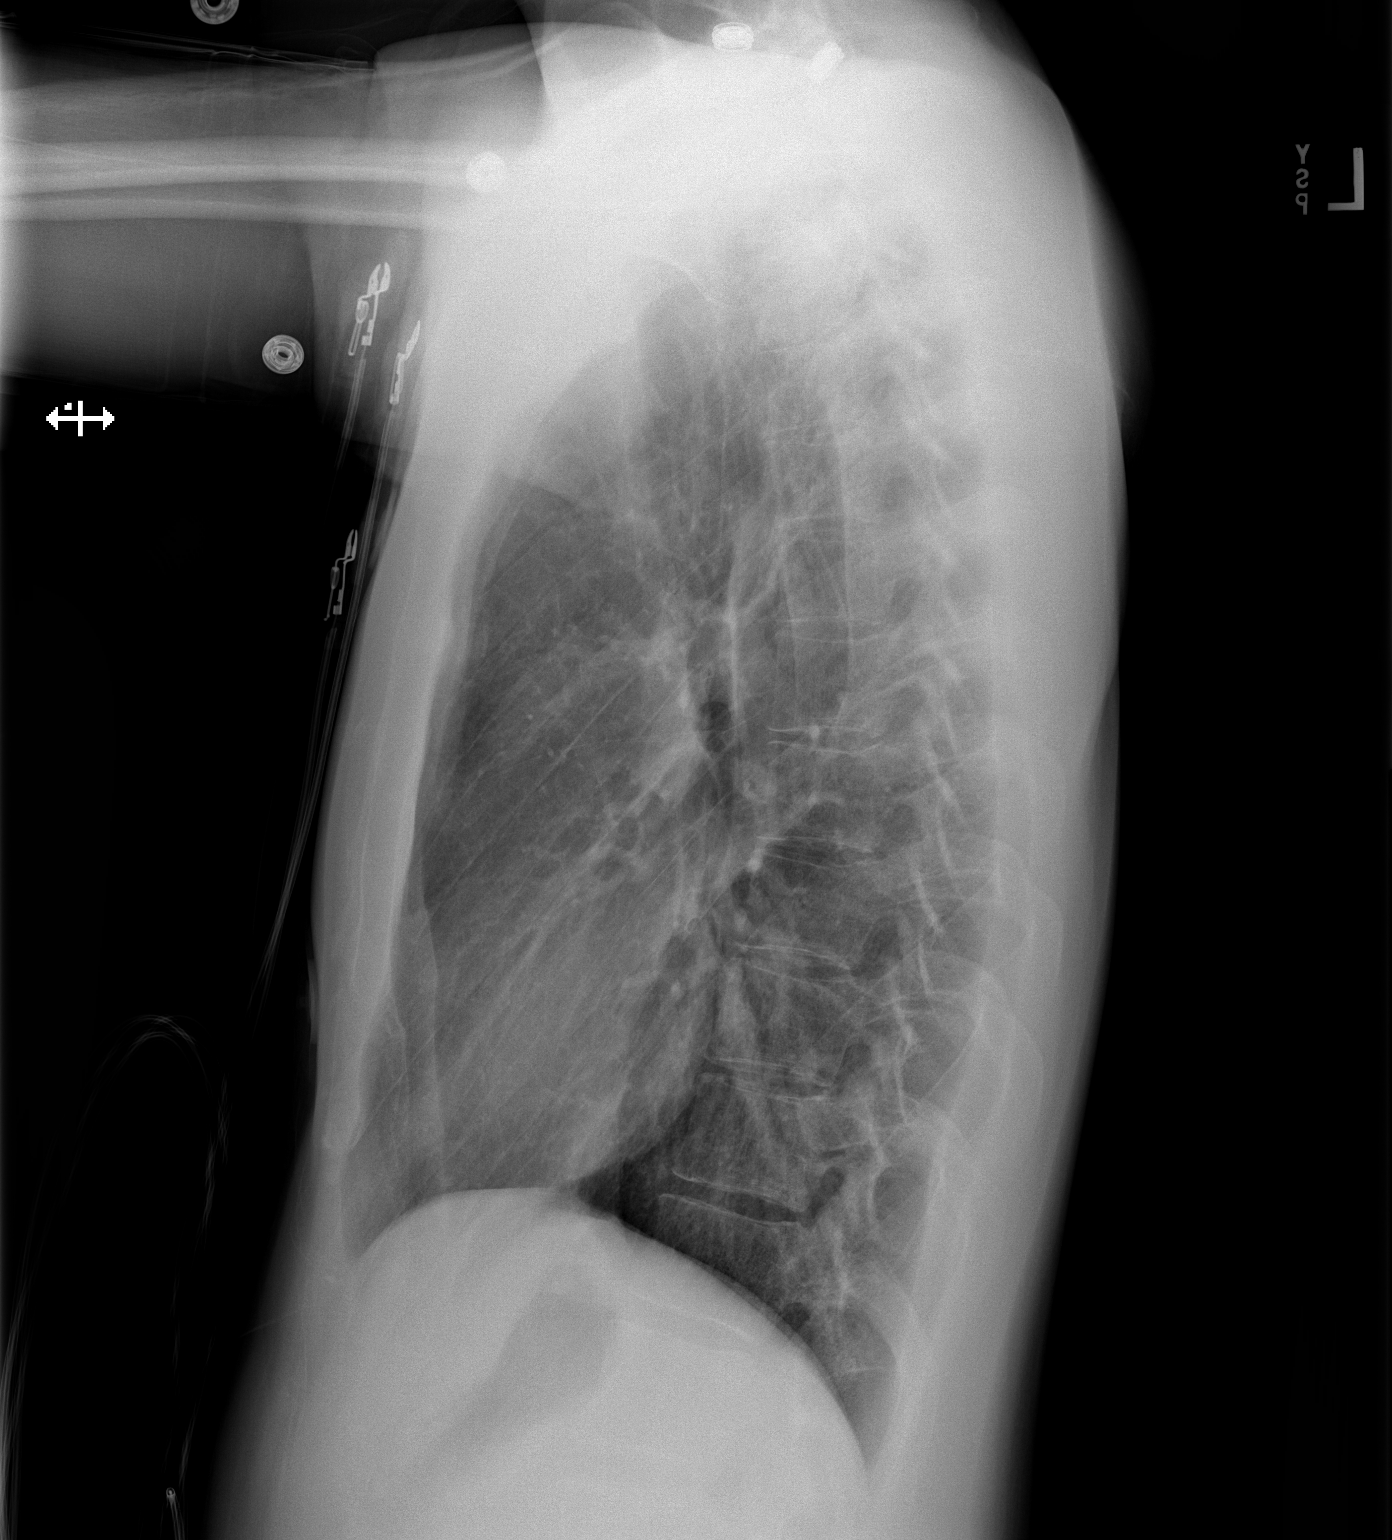

[2 of 2 positions shown; findings below may reference images not displayed]

FINDINGS: The heart size and mediastinal contours are within normal limits.
Both lungs are clear. No pneumothorax or pleural effusion is noted.
The visualized skeletal structures are unremarkable.
IMPRESSION: No active cardiopulmonary disease.

## 2023-10-12 NOTE — Therapy (Unsigned)
 OUTPATIENT PHYSICAL THERAPY SHOULDER EVALUATION   Patient Name: Jeffrey Wells. MRN: [id number] DOB:03/29/1985, 38 y.o., male Today's Date: 10/13/2023  END OF SESSION:  PT End of Session - 10/13/23 0834     Visit Number 1    Number of Visits 16    Date for PT Re-Evaluation 12/13/23    Authorization Type [employer]    PT Start Time 0830    PT Stop Time 0915    PT Time Calculation (min) 45 min    Activity Tolerance Patient tolerated treatment well;Patient limited by pain    Behavior During Therapy [facility] for tasks assessed/performed          Past Medical History:  Diagnosis Date   COVID-19    Past Surgical History:  Procedure Laterality Date   KNEE SURGERY  2011   SHOULDER SURGERY Left    Patient Active Problem List   Diagnosis Date Noted   History of pericarditis 01/29/2019   Heart disease 02/02/2018   Precordial chest pain 12/20/2017   Pericarditis 12/20/2017    PCP: none  REFERRING PROVIDER: Cristy Bonner DASEN, MD  REFERRING DIAG: RSS SAD BT POSTERIOR LABRAL REPAIR   THERAPY DIAG:  Acute pain of right shoulder  Abnormal posture  Muscle weakness (generalized)  Rationale for Evaluation and Treatment: Rehabilitation  ONSET DATE: 09/25/23 DOS  SUBJECTIVE:                                                                                                                                                                                      SUBJECTIVE STATEMENT: Reports history of gradual onset R shoulder pain leading up to surgery Hand dominance: Right  PERTINENT HISTORY: L shoulder surgery 2017, labral tear  PAIN:  Are you having pain? Yes: NPRS scale: 10/10 at worst Pain location: R shoulder Pain description: ache, sharp Aggravating factors: use Relieving factors: rest, meds  PRECAUTIONS: Shoulder  RED FLAGS: None   WEIGHT BEARING RESTRICTIONS: No  FALLS:  Has patient fallen in last 6 months? No  OCCUPATION: Desk work  PLOF: Independent  PATIENT  GOALS:To use my shoulder again  NEXT MD VISIT:   OBJECTIVE:  Note: Objective measures were completed at Evaluation unless otherwise noted.  DIAGNOSTIC FINDINGS:  None noted  PATIENT SURVEYS:  Quick Dash: 45/55   Minimally Clinically Important Difference (MCID): 15-20 points  (Franchignoni, F. et al. (2013). Minimally clinically important difference of the disabilities of the arm, shoulder, and hand outcome measures (DASH) and its shortened version (Quick DASH). Journal of Orthopaedic & Sports Physical Therapy, 44(1), 30-39)    POSTURE: Rounded shoulders  UPPER EXTREMITY ROM:   Passive ROM Right eval Left eval  Shoulder flexion 90D   Shoulder extension    Shoulder abduction 45d   Shoulder adduction    Shoulder internal rotation 30d   Shoulder external rotation 30d   Elbow flexion WNL   Elbow extension WNL   Wrist flexion WNL   Wrist extension WNL   Wrist ulnar deviation    Wrist radial deviation    Wrist pronation    Wrist supination    (Blank rows = not tested)  UPPER EXTREMITY MMT: N/T  MMT Right eval Left eval  Shoulder flexion    Shoulder extension    Shoulder abduction    Shoulder adduction    Shoulder internal rotation    Shoulder external rotation    Middle trapezius    Lower trapezius    Elbow flexion    Elbow extension    Wrist flexion    Wrist extension    Wrist ulnar deviation    Wrist radial deviation    Wrist pronation    Wrist supination    Grip strength (lbs)    (Blank rows = not tested)  SHOULDER SPECIAL TESTS: defered  JOINT MOBILITY TESTING:  N/A  PALPATION:  N/A                                                                                                                             TREATMENT: [facility] Adult PT Treatment:                                                DATE: 10/13/23 Eval and HEP Self Care: Additional minutes spent for educating on updated Therapeutic Home Exercise Program as well as comparing current status  to condition at start of symptoms. This included exercises focusing on stretching, strengthening, with focus on eccentric aspects. Long term goals include an improvement in range of motion, strength, endurance as well as avoiding reinjury. Patient's frequency would include in 1-2 times a day, 3-5 times a week for a duration of 6-12 weeks. Proper technique shown and discussed handout in great detail. All questions were discussed and addressed.      PATIENT EDUCATION: Education details: Discussed eval findings, rehab rationale and POC and patient is in agreement Person educated: Patient Education method: Explanation and Handouts Education comprehension: verbalized understanding and needs further education  HOME EXERCISE PROGRAM: Access Code: [email or web address] URL: [email or web address] Date: 10/13/2023 Prepared by: Toshiye Kever  Exercises - Wrist Circumduction AROM  - 5 x daily - 7 x weekly - 1 sets - 20 reps - Supported Elbow Flexion Extension PROM  - 5 x daily - 7 x weekly - 1 sets - 20 reps - Seated Shoulder Pendulum Exercise  - 5 x daily - 7 x weekly - 1 sets - 10 reps - 60sec hold - Seated Forearm Pronation Supination AROM  - 1 x daily -  7 x weekly - 3 sets - 10 reps  ASSESSMENT:  CLINICAL IMPRESSION: Patient is a 38 y.o. male who was seen today for physical therapy evaluation and treatment following R shoulder RSS SAD BT POSTERIOR LABRAL REPAIR.  Patient restricted to sling and is currently 3 weks post-op/phase 1 of protocol  OBJECTIVE IMPAIRMENTS: decreased activity tolerance, decreased knowledge of use of DME, decreased mobility, decreased ROM, decreased strength, increased fascial restrictions, impaired perceived functional ability, increased muscle spasms, impaired UE functional use, postural dysfunction, and pain.   ACTIVITY LIMITATIONS: carrying, lifting, bed mobility, toileting, dressing, and reach over head  PERSONAL FACTORS: Fitness and Time since onset of  injury/illness/exacerbation are also affecting patient's functional outcome.   REHAB POTENTIAL: Good  CLINICAL DECISION MAKING: Evolving/moderate complexity  EVALUATION COMPLEXITY: Low   GOALS: Goals reviewed with patient? No  SHORT TERM GOALS: Target date: 11/10/2023  Patient to demonstrate independence in HEP  Baseline: [id number] Goal status: INITIAL  2.  PROM ER 45d Baseline:  Goal status: INITIAL   LONG TERM GOALS: Target date: 12/08/2023    Patient will score at least 25/55 on qDASH to signify clinically meaningful improvement in functional abilities.   Baseline: 45/55 Goal status: INITIAL  2.  Patient will acknowledge 4/10 pain at least once during episode of care  Baseline: 9/10 Goal status: INITIAL  3.  160d AROM flexion an abduction Baseline: 90/45d respectively Goal status: INITIAL  4.  4/5 shoulder strength when appropriate Baseline: TBD Goal status: INITIAL    PLAN:  PT FREQUENCY: 1-2x/week  PT DURATION: 8 weeks  PLANNED INTERVENTIONS: 97110-Therapeutic exercises, 97530- Therapeutic activity, 97112- Neuromuscular re-education, 97535- Self Care, [id number]- Manual therapy, and Patient/Family education  PLAN FOR NEXT SESSION: HEP review and update, manual techniques as appropriate, aerobic tasks, ROM and flexibility activities, strengthening and PREs, TPDN, gait and balance training as needed     Reyes CHRISTELLA Kohut, PT 10/13/2023, 9:19 AM

## 2023-10-13 ENCOUNTER — Ambulatory Visit: Attending: Orthopaedic Surgery

## 2023-10-13 DIAGNOSIS — M6281 Muscle weakness (generalized): Secondary | ICD-10-CM | POA: Insufficient documentation

## 2023-10-13 DIAGNOSIS — R293 Abnormal posture: Secondary | ICD-10-CM | POA: Diagnosis present

## 2023-10-13 DIAGNOSIS — M25511 Pain in right shoulder: Secondary | ICD-10-CM | POA: Diagnosis present

## 2023-10-17 ENCOUNTER — Ambulatory Visit

## 2023-10-17 DIAGNOSIS — M25511 Pain in right shoulder: Secondary | ICD-10-CM | POA: Diagnosis not present

## 2023-10-17 DIAGNOSIS — R293 Abnormal posture: Secondary | ICD-10-CM

## 2023-10-17 DIAGNOSIS — M6281 Muscle weakness (generalized): Secondary | ICD-10-CM

## 2023-10-17 NOTE — Therapy (Signed)
 OUTPATIENT PHYSICAL THERAPY TREATMENT NOTE   Patient Name: Jeffrey Wells. MRN: [id number] DOB:[birth year], 38 y.o., male Today's Date: 10/17/2023  END OF SESSION:  PT End of Session - 10/17/23 1128     Visit Number 2    Number of Visits 16    Date for PT Re-Evaluation 12/13/23    Authorization Type [employer]    PT Start Time 1130    PT Stop Time 1208    PT Time Calculation (min) 38 min    Activity Tolerance Patient tolerated treatment well;Patient limited by pain    Behavior During Therapy [facility] for tasks assessed/performed           Past Medical History:  Diagnosis Date   COVID-19    Past Surgical History:  Procedure Laterality Date   KNEE SURGERY  2011   SHOULDER SURGERY Left    Patient Active Problem List   Diagnosis Date Noted   History of pericarditis 01/29/2019   Heart disease 02/02/2018   Precordial chest pain 12/20/2017   Pericarditis 12/20/2017    PCP: none  REFERRING PROVIDER: Cristy Bonner DASEN, MD  REFERRING DIAG: RSS SAD BT POSTERIOR LABRAL REPAIR   THERAPY DIAG:  Acute pain of right shoulder  Abnormal posture  Muscle weakness (generalized)  Rationale for Evaluation and Treatment: Rehabilitation  ONSET DATE: 09/25/23 DOS  Next MD appt: end of the month  SUBJECTIVE:                                                                                                                                                                                      SUBJECTIVE STATEMENT: Patient reports that he is still having pain and is having a hard time sleeping due to the pain and trying to find a comfortable position to sleep in.   EVAL: Reports history of gradual onset R shoulder pain leading up to surgery Hand dominance: Right  PERTINENT HISTORY: L shoulder surgery 2017, labral tear  PAIN:  Are you having pain? Yes: NPRS scale: 10/10 at worst Pain location: R shoulder Pain description: ache, sharp Aggravating factors: use Relieving factors: rest,  meds  PRECAUTIONS: Shoulder  RED FLAGS: None   WEIGHT BEARING RESTRICTIONS: No  FALLS:  Has patient fallen in last 6 months? No  OCCUPATION: Desk work  PLOF: Independent  PATIENT GOALS:To use my shoulder again  NEXT MD VISIT:   OBJECTIVE:  Note: Objective measures were completed at Evaluation unless otherwise noted.  DIAGNOSTIC FINDINGS:  None noted  PATIENT SURVEYS:  Quick Dash: 45/55   Minimally Clinically Important Difference (MCID): 15-20 points  (Franchignoni, F. et al. (2013). Minimally clinically important difference of the disabilities of  the arm, shoulder, and hand outcome measures (DASH) and its shortened version (Quick DASH). Journal of Orthopaedic & Sports Physical Therapy, 44(1), 30-39)    POSTURE: Rounded shoulders  UPPER EXTREMITY ROM:   Passive ROM Right eval Left eval  Shoulder flexion 90D   Shoulder extension    Shoulder abduction 45d   Shoulder adduction    Shoulder internal rotation 30d   Shoulder external rotation 30d   Elbow flexion WNL   Elbow extension WNL   Wrist flexion WNL   Wrist extension WNL   Wrist ulnar deviation    Wrist radial deviation    Wrist pronation    Wrist supination    (Blank rows = not tested)  UPPER EXTREMITY MMT: N/T  MMT Right eval Left eval  Shoulder flexion    Shoulder extension    Shoulder abduction    Shoulder adduction    Shoulder internal rotation    Shoulder external rotation    Middle trapezius    Lower trapezius    Elbow flexion    Elbow extension    Wrist flexion    Wrist extension    Wrist ulnar deviation    Wrist radial deviation    Wrist pronation    Wrist supination    Grip strength (lbs)    (Blank rows = not tested)  SHOULDER SPECIAL TESTS: defered  JOINT MOBILITY TESTING:  N/A  PALPATION:  N/A                                                                                                                             TREATMENT: [facility] Adult PT Treatment:                                                 DATE: 10/17/23 Therapeutic Exercise: Seated scap retraction in sling 2x10 Wrist circumduction x20 CW/CCW Wrist flexion/extension x20 ea Elbow flex/ext AAROM using LUE 2x15 Forearm pronation/supination 2x15 PROM all directions within protocol limitations  [facility] Adult PT Treatment:                                                DATE: 10/13/23 Eval and HEP Self Care: Additional minutes spent for educating on updated Therapeutic Home Exercise Program as well as comparing current status to condition at start of symptoms. This included exercises focusing on stretching, strengthening, with focus on eccentric aspects. Long term goals include an improvement in range of motion, strength, endurance as well as avoiding reinjury. Patient's frequency would include in 1-2 times a day, 3-5 times a week for a duration of 6-12 weeks. Proper technique shown and discussed handout in great detail. All questions were discussed and addressed.  PATIENT EDUCATION: Education details: Discussed eval findings, rehab rationale and POC and patient is in agreement Person educated: Patient Education method: Explanation and Handouts Education comprehension: verbalized understanding and needs further education  HOME EXERCISE PROGRAM: Access Code: [email or web address] URL: [email or web address] Date: 10/13/2023 Prepared by: Reyes Kohut  Exercises - Wrist Circumduction AROM  - 5 x daily - 7 x weekly - 1 sets - 20 reps - Supported Elbow Flexion Extension PROM  - 5 x daily - 7 x weekly - 1 sets - 20 reps - Seated Shoulder Pendulum Exercise  - 5 x daily - 7 x weekly - 1 sets - 10 reps - 60sec hold - Seated Forearm Pronation Supination AROM  - 1 x daily - 7 x weekly - 3 sets - 10 reps  ASSESSMENT:  CLINICAL IMPRESSION: Patient presents to first follow up PT session reporting continued pain and difficulty sleeping. He has been compliant with his HEP with no issues. He states he  sees Careers adviser at the end of the month, is looking forward to no longer being in the sling. He has returned to work, this is going ok, is unable to type with RUE due to bulkiness of sling. Session today focused on PROM and some gentle AAROM distal to shoulder within protocol limitations. Patient continues to benefit from skilled PT services and should be progressed as able to improve functional independence.   EVAL: Patient is a 38 y.o. male who was seen today for physical therapy evaluation and treatment following R shoulder RSS SAD BT POSTERIOR LABRAL REPAIR.  Patient restricted to sling and is currently 3 weks post-op/phase 1 of protocol  OBJECTIVE IMPAIRMENTS: decreased activity tolerance, decreased knowledge of use of DME, decreased mobility, decreased ROM, decreased strength, increased fascial restrictions, impaired perceived functional ability, increased muscle spasms, impaired UE functional use, postural dysfunction, and pain.   ACTIVITY LIMITATIONS: carrying, lifting, bed mobility, toileting, dressing, and reach over head  PERSONAL FACTORS: Fitness and Time since onset of injury/illness/exacerbation are also affecting patient's functional outcome.   REHAB POTENTIAL: Good  CLINICAL DECISION MAKING: Evolving/moderate complexity  EVALUATION COMPLEXITY: Low   GOALS: Goals reviewed with patient? No  SHORT TERM GOALS: Target date: 11/10/2023  Patient to demonstrate independence in HEP  Baseline: [id number] Goal status: INITIAL  2.  PROM ER 45d Baseline:  Goal status: INITIAL   LONG TERM GOALS: Target date: 12/08/2023    Patient will score at least 25/55 on qDASH to signify clinically meaningful improvement in functional abilities.   Baseline: 45/55 Goal status: INITIAL  2.  Patient will acknowledge 4/10 pain at least once during episode of care  Baseline: 9/10 Goal status: INITIAL  3.  160d AROM flexion an abduction Baseline: 90/45d respectively Goal status: INITIAL  4.   4/5 shoulder strength when appropriate Baseline: TBD Goal status: INITIAL    PLAN:  PT FREQUENCY: 1-2x/week  PT DURATION: 8 weeks  PLANNED INTERVENTIONS: 97110-Therapeutic exercises, 97530- Therapeutic activity, 97112- Neuromuscular re-education, 97535- Self Care, [id number]- Manual therapy, and Patient/Family education  PLAN FOR NEXT SESSION: HEP review and update, manual techniques as appropriate, aerobic tasks, ROM and flexibility activities, strengthening and PREs, TPDN, gait and balance training as needed     Corean Pouch, PTA 10/17/2023, 12:13 PM

## 2023-10-19 ENCOUNTER — Ambulatory Visit

## 2023-10-25 ENCOUNTER — Encounter

## 2023-10-26 NOTE — Therapy (Deleted)
 OUTPATIENT PHYSICAL THERAPY TREATMENT NOTE   Patient Name: Jeffrey Wells. MRN: [id number] DOB:06-29-1985, 38 y.o., male Today's Date: 10/26/2023  END OF SESSION:     Past Medical History:  Diagnosis Date   COVID-19    Past Surgical History:  Procedure Laterality Date   KNEE SURGERY  2011   SHOULDER SURGERY Left    Patient Active Problem List   Diagnosis Date Noted   History of pericarditis 01/29/2019   Heart disease 02/02/2018   Precordial chest pain 12/20/2017   Pericarditis 12/20/2017    PCP: none  REFERRING PROVIDER: Cristy Bonner DASEN, MD  REFERRING DIAG: RSS SAD BT POSTERIOR LABRAL REPAIR   THERAPY DIAG:  No diagnosis found.  Rationale for Evaluation and Treatment: Rehabilitation  ONSET DATE: 09/25/23 DOS  Next MD appt: end of the month  SUBJECTIVE:                                                                                                                                                                                      SUBJECTIVE STATEMENT: Patient reports that he is still having pain and is having a hard time sleeping due to the pain and trying to find a comfortable position to sleep in.   EVAL: Reports history of gradual onset R shoulder pain leading up to surgery Hand dominance: Right  PERTINENT HISTORY: L shoulder surgery 2017, labral tear  PAIN:  Are you having pain? Yes: NPRS scale: 10/10 at worst Pain location: R shoulder Pain description: ache, sharp Aggravating factors: use Relieving factors: rest, meds  PRECAUTIONS: Shoulder  RED FLAGS: None   WEIGHT BEARING RESTRICTIONS: No  FALLS:  Has patient fallen in last 6 months? No  OCCUPATION: Desk work  PLOF: Independent  PATIENT GOALS:To use my shoulder again  NEXT MD VISIT:   OBJECTIVE:  Note: Objective measures were completed at Evaluation unless otherwise noted.  DIAGNOSTIC FINDINGS:  None noted  PATIENT SURVEYS:  Quick Dash: 45/55   Minimally Clinically  Important Difference (MCID): 15-20 points  (Franchignoni, F. et al. (2013). Minimally clinically important difference of the disabilities of the arm, shoulder, and hand outcome measures (DASH) and its shortened version (Quick DASH). Journal of Orthopaedic & Sports Physical Therapy, 44(1), 30-39)    POSTURE: Rounded shoulders  UPPER EXTREMITY ROM:   Passive ROM Right eval Left eval  Shoulder flexion 90D   Shoulder extension    Shoulder abduction 45d   Shoulder adduction    Shoulder internal rotation 30d   Shoulder external rotation 30d   Elbow flexion WNL   Elbow extension WNL   Wrist flexion WNL   Wrist extension WNL   Wrist ulnar  deviation    Wrist radial deviation    Wrist pronation    Wrist supination    (Blank rows = not tested)  UPPER EXTREMITY MMT: N/T  MMT Right eval Left eval  Shoulder flexion    Shoulder extension    Shoulder abduction    Shoulder adduction    Shoulder internal rotation    Shoulder external rotation    Middle trapezius    Lower trapezius    Elbow flexion    Elbow extension    Wrist flexion    Wrist extension    Wrist ulnar deviation    Wrist radial deviation    Wrist pronation    Wrist supination    Grip strength (lbs)    (Blank rows = not tested)  SHOULDER SPECIAL TESTS: defered  JOINT MOBILITY TESTING:  N/A  PALPATION:  N/A                                                                                                                             TREATMENT: [facility] Adult PT Treatment:                                                DATE: 10/17/23 Therapeutic Exercise: Seated scap retraction in sling 2x10 Wrist circumduction x20 CW/CCW Wrist flexion/extension x20 ea Elbow flex/ext AAROM using LUE 2x15 Forearm pronation/supination 2x15 PROM all directions within protocol limitations  [facility] Adult PT Treatment:                                                DATE: 10/13/23 Eval and HEP Self Care: Additional minutes spent for  educating on updated Therapeutic Home Exercise Program as well as comparing current status to condition at start of symptoms. This included exercises focusing on stretching, strengthening, with focus on eccentric aspects. Long term goals include an improvement in range of motion, strength, endurance as well as avoiding reinjury. Patient's frequency would include in 1-2 times a day, 3-5 times a week for a duration of 6-12 weeks. Proper technique shown and discussed handout in great detail. All questions were discussed and addressed.      PATIENT EDUCATION: Education details: Discussed eval findings, rehab rationale and POC and patient is in agreement Person educated: Patient Education method: Explanation and Handouts Education comprehension: verbalized understanding and needs further education  HOME EXERCISE PROGRAM: Access Code: [email or web address] URL: [email or web address] Date: 10/13/2023 Prepared by: Verenice Westrich  Exercises - Wrist Circumduction AROM  - 5 x daily - 7 x weekly - 1 sets - 20 reps - Supported Elbow Flexion Extension PROM  - 5 x daily - 7 x weekly - 1 sets - 20 reps - Seated Shoulder Pendulum  Exercise  - 5 x daily - 7 x weekly - 1 sets - 10 reps - 60sec hold - Seated Forearm Pronation Supination AROM  - 1 x daily - 7 x weekly - 3 sets - 10 reps  ASSESSMENT:  CLINICAL IMPRESSION: Patient presents to first follow up PT session reporting continued pain and difficulty sleeping. He has been compliant with his HEP with no issues. He states he sees Careers adviser at the end of the month, is looking forward to no longer being in the sling. He has returned to work, this is going ok, is unable to type with RUE due to bulkiness of sling. Session today focused on PROM and some gentle AAROM distal to shoulder within protocol limitations. Patient continues to benefit from skilled PT services and should be progressed as able to improve functional independence.   EVAL: Patient is a 38 y.o.  male who was seen today for physical therapy evaluation and treatment following R shoulder RSS SAD BT POSTERIOR LABRAL REPAIR.  Patient restricted to sling and is currently 3 weks post-op/phase 1 of protocol  OBJECTIVE IMPAIRMENTS: decreased activity tolerance, decreased knowledge of use of DME, decreased mobility, decreased ROM, decreased strength, increased fascial restrictions, impaired perceived functional ability, increased muscle spasms, impaired UE functional use, postural dysfunction, and pain.   ACTIVITY LIMITATIONS: carrying, lifting, bed mobility, toileting, dressing, and reach over head  PERSONAL FACTORS: Fitness and Time since onset of injury/illness/exacerbation are also affecting patient's functional outcome.   REHAB POTENTIAL: Good  CLINICAL DECISION MAKING: Evolving/moderate complexity  EVALUATION COMPLEXITY: Low   GOALS: Goals reviewed with patient? No  SHORT TERM GOALS: Target date: 11/10/2023  Patient to demonstrate independence in HEP  Baseline: [id number] Goal status: INITIAL  2.  PROM ER 45d Baseline:  Goal status: INITIAL   LONG TERM GOALS: Target date: 12/08/2023    Patient will score at least 25/55 on qDASH to signify clinically meaningful improvement in functional abilities.   Baseline: 45/55 Goal status: INITIAL  2.  Patient will acknowledge 4/10 pain at least once during episode of care  Baseline: 9/10 Goal status: INITIAL  3.  160d AROM flexion an abduction Baseline: 90/45d respectively Goal status: INITIAL  4.  4/5 shoulder strength when appropriate Baseline: TBD Goal status: INITIAL    PLAN:  PT FREQUENCY: 1-2x/week  PT DURATION: 8 weeks  PLANNED INTERVENTIONS: 97110-Therapeutic exercises, 97530- Therapeutic activity, 97112- Neuromuscular re-education, 97535- Self Care, [id number]- Manual therapy, and Patient/Family education  PLAN FOR NEXT SESSION: HEP review and update, manual techniques as appropriate, aerobic tasks, ROM and  flexibility activities, strengthening and PREs, TPDN, gait and balance training as needed     Reyes CHRISTELLA Kohut, PT 10/26/2023, 4:29 PM

## 2023-10-27 ENCOUNTER — Ambulatory Visit

## 2023-10-31 ENCOUNTER — Ambulatory Visit: Admitting: Physical Therapy

## 2023-10-31 DIAGNOSIS — M25511 Pain in right shoulder: Secondary | ICD-10-CM | POA: Diagnosis not present

## 2023-10-31 DIAGNOSIS — M6281 Muscle weakness (generalized): Secondary | ICD-10-CM

## 2023-10-31 DIAGNOSIS — R293 Abnormal posture: Secondary | ICD-10-CM

## 2023-10-31 NOTE — Therapy (Signed)
 OUTPATIENT PHYSICAL THERAPY TREATMENT NOTE   Patient Name: Jeffrey Wells. MRN: [id number] DOB:[birth year], 38 y.o., male Today's Date: 10/31/2023  END OF SESSION:  PT End of Session - 10/31/23 1205     Visit Number 3    Number of Visits 16    Date for Recertification  12/13/23    Authorization Type [employer]    PT Start Time 1130    PT Stop Time 1200    PT Time Calculation (min) 30 min            Past Medical History:  Diagnosis Date   COVID-19    Past Surgical History:  Procedure Laterality Date   KNEE SURGERY  2011   SHOULDER SURGERY Left    Patient Active Problem List   Diagnosis Date Noted   History of pericarditis 01/29/2019   Heart disease 02/02/2018   Precordial chest pain 12/20/2017   Pericarditis 12/20/2017    PCP: none  REFERRING PROVIDER: Cristy Bonner DASEN, MD  REFERRING DIAG: RSS SAD BT POSTERIOR LABRAL REPAIR   THERAPY DIAG:  Acute pain of right shoulder  Abnormal posture  Muscle weakness (generalized)  Rationale for Evaluation and Treatment: Rehabilitation  ONSET DATE: 09/25/23 DOS  Next MD appt: end of the month  SUBJECTIVE:                                                                                                                                                                                      SUBJECTIVE STATEMENT: Pt attended today's session with reports of 4/10 pain. Pt stated that they have maintained good compliance with current HEP.  Pt has 6wk f/u with surgeon on 11/07/2023    EVAL: Reports history of gradual onset R shoulder pain leading up to surgery Hand dominance: Right  PERTINENT HISTORY: L shoulder surgery 2017, labral tear  PAIN:  Are you having pain? Yes: NPRS scale: 10/10 at worst Pain location: R shoulder Pain description: ache, sharp Aggravating factors: use Relieving factors: rest, meds  PRECAUTIONS: Shoulder  RED FLAGS: None   WEIGHT BEARING RESTRICTIONS: No  FALLS:  Has patient fallen in  last 6 months? No  OCCUPATION: Desk work  PLOF: Independent  PATIENT GOALS:To use my shoulder again  NEXT MD VISIT:   OBJECTIVE:  Note: Objective measures were completed at Evaluation unless otherwise noted.  DIAGNOSTIC FINDINGS:  None noted  PATIENT SURVEYS:  Quick Dash: 45/55   Minimally Clinically Important Difference (MCID): 15-20 points  (Franchignoni, F. et al. (2013). Minimally clinically important difference of the disabilities of the arm, shoulder, and hand outcome measures (DASH) and its shortened version (Quick DASH). Journal of Orthopaedic & Sports Physical  Therapy, 44(1), 30-39)    POSTURE: Rounded shoulders  UPPER EXTREMITY ROM:   Passive ROM Right eval Left eval  Shoulder flexion 90D   Shoulder extension    Shoulder abduction 45d   Shoulder adduction    Shoulder internal rotation 30d   Shoulder external rotation 30d   Elbow flexion WNL   Elbow extension WNL   Wrist flexion WNL   Wrist extension WNL   Wrist ulnar deviation    Wrist radial deviation    Wrist pronation    Wrist supination    (Blank rows = not tested)  UPPER EXTREMITY MMT: N/T  MMT Right eval Left eval  Shoulder flexion    Shoulder extension    Shoulder abduction    Shoulder adduction    Shoulder internal rotation    Shoulder external rotation    Middle trapezius    Lower trapezius    Elbow flexion    Elbow extension    Wrist flexion    Wrist extension    Wrist ulnar deviation    Wrist radial deviation    Wrist pronation    Wrist supination    Grip strength (lbs)    (Blank rows = not tested)  SHOULDER SPECIAL TESTS: defered  JOINT MOBILITY TESTING:  N/A  PALPATION:  N/A                                                                                                                             TREATMENT: [facility] Adult PT Treatment:                                                DATE: 10/31/2023 Therapeutic Exercise: PROM all directions within protocol  limitations Therapeutic Activity: Ojective testing to ensure all phase 1 goals are met Pt education regarding phase II protocol, prognosis, future HEP changes and answering all pt questions regarding surgery and at home function   [facility] Adult PT Treatment:                                                DATE: 10/17/23 Therapeutic Exercise: Seated scap retraction in sling 2x10 Wrist circumduction x20 CW/CCW Wrist flexion/extension x20 ea Elbow flex/ext AAROM using LUE 2x15 Forearm pronation/supination 2x15 PROM all directions within protocol limitations  [facility] Adult PT Treatment:                                                DATE: 10/13/23 Eval and HEP Self Care: Additional minutes spent for educating on updated Therapeutic Home Exercise Program as  well as comparing current status to condition at start of symptoms. This included exercises focusing on stretching, strengthening, with focus on eccentric aspects. Long term goals include an improvement in range of motion, strength, endurance as well as avoiding reinjury. Patient's frequency would include in 1-2 times a day, 3-5 times a week for a duration of 6-12 weeks. Proper technique shown and discussed handout in great detail. All questions were discussed and addressed.      PATIENT EDUCATION: Education details: Discussed eval findings, rehab rationale and POC and patient is in agreement Person educated: Patient Education method: Explanation and Handouts Education comprehension: verbalized understanding and needs further education  HOME EXERCISE PROGRAM: Access Code: [email or web address] URL: [email or web address] Date: 10/13/2023 Prepared by: Jeffrey Ziemba  Exercises - Wrist Circumduction AROM  - 5 x daily - 7 x weekly - 1 sets - 20 reps - Supported Elbow Flexion Extension PROM  - 5 x daily - 7 x weekly - 1 sets - 20 reps - Seated Shoulder Pendulum Exercise  - 5 x daily - 7 x weekly - 1 sets - 10 reps - 60sec hold - Seated Forearm  Pronation Supination AROM  - 1 x daily - 7 x weekly - 3 sets - 10 reps  ASSESSMENT:  CLINICAL IMPRESSION: Pt attended physical therapy session for continuation of treatment regarding L labral repair. Today's treatment focused on improvement of  shoulder P/AROM within surgical limits and education regarding future phase once pt follows up with surgeon on 11/07/2023. Pt continues to meet all goals of phase 1, pt has most difficulty with flexion likely due to biceps sensitivity/pain Pt showed good tolerance to administered treatment with no adverse effects by the end of session. Skilled intervention was utilized via activity modification for pt tolerance with task completion, functional progression/regression promoting best outcomes inline with current rehab goals, as well as minimal  verbal/tactile cuing alongside no physical assistance for safe and appropriate performance of today's activities. F/u on surgeons recommendation following pts 9/30 appt, and progress within phase II of protocol as indicated.   EVAL: Patient is a 38 y.o. male who was seen today for physical therapy evaluation and treatment following R shoulder RSS SAD BT POSTERIOR LABRAL REPAIR.  Patient restricted to sling and is currently 3 weks post-op/phase 1 of protocol  OBJECTIVE IMPAIRMENTS: decreased activity tolerance, decreased knowledge of use of DME, decreased mobility, decreased ROM, decreased strength, increased fascial restrictions, impaired perceived functional ability, increased muscle spasms, impaired UE functional use, postural dysfunction, and pain.   ACTIVITY LIMITATIONS: carrying, lifting, bed mobility, toileting, dressing, and reach over head  PERSONAL FACTORS: Fitness and Time since onset of injury/illness/exacerbation are also affecting patient's functional outcome.   REHAB POTENTIAL: Good  CLINICAL DECISION MAKING: Evolving/moderate complexity  EVALUATION COMPLEXITY: Low   GOALS: Goals reviewed with patient?  No  SHORT TERM GOALS: Target date: 11/10/2023  Patient to demonstrate independence in HEP  Baseline: [id number] Goal status: INITIAL  2.  PROM ER 45d Baseline:  Goal status: INITIAL   LONG TERM GOALS: Target date: 12/08/2023    Patient will score at least 25/55 on qDASH to signify clinically meaningful improvement in functional abilities.   Baseline: 45/55 Goal status: INITIAL  2.  Patient will acknowledge 4/10 pain at least once during episode of care  Baseline: 9/10 Goal status: INITIAL  3.  160d AROM flexion an abduction Baseline: 90/45d respectively Goal status: INITIAL  4.  4/5 shoulder strength when appropriate Baseline: TBD Goal status: INITIAL    PLAN:  PT FREQUENCY: 1-2x/week  PT DURATION: 8 weeks  PLANNED INTERVENTIONS: 97110-Therapeutic exercises, 97530- Therapeutic activity, 97112- Neuromuscular re-education, 97535- Self Care, [id number]- Manual therapy, and Patient/Family education  PLAN FOR NEXT SESSION: HEP review and update, manual techniques as appropriate, aerobic tasks, ROM and flexibility activities, strengthening and PREs, TPDN, gait and balance training as needed     Mabel Kiang, PT, DPT 10/31/2023, 12:07 PM

## 2023-11-02 ENCOUNTER — Encounter

## 2023-11-09 ENCOUNTER — Ambulatory Visit: Attending: Orthopaedic Surgery | Admitting: Physical Therapy

## 2023-11-09 ENCOUNTER — Encounter: Payer: Self-pay | Admitting: Physical Therapy

## 2023-11-09 DIAGNOSIS — M6281 Muscle weakness (generalized): Secondary | ICD-10-CM | POA: Insufficient documentation

## 2023-11-09 DIAGNOSIS — R293 Abnormal posture: Secondary | ICD-10-CM | POA: Diagnosis present

## 2023-11-09 DIAGNOSIS — M25511 Pain in right shoulder: Secondary | ICD-10-CM | POA: Diagnosis present

## 2023-11-09 NOTE — Therapy (Addendum)
 OUTPATIENT PHYSICAL THERAPY TREATMENT NOTE PHYSICAL THERAPY DISCHARGE SUMMARY  Visits from Start of Care: 4  Current functional level related to goals / functional outcomes: See goals   Remaining deficits: Current status unknown   Education / Equipment: HEP   Patient agrees to discharge. Patient goals were not met. Patient is being discharged due to not returning since the last visit.  Kristoffer Leamon PT, DPT, LAT, ATC  12/08/23  7:50 AM      Patient Name: Jeffrey Wells. MRN: [id number] DOB:Jun 14, 1985, 38 y.o., male Today's Date: 11/09/2023  END OF SESSION:  PT End of Session - 11/09/23 0914     Visit Number 4    Number of Visits 16    Date for Recertification  12/13/23    Authorization Type [employer]    PT Start Time 0830    PT Stop Time 0910    PT Time Calculation (min) 40 min    Activity Tolerance Patient tolerated treatment well    Behavior During Therapy [facility] for tasks assessed/performed             Past Medical History:  Diagnosis Date   COVID-19    Past Surgical History:  Procedure Laterality Date   KNEE SURGERY  2011   SHOULDER SURGERY Left    Patient Active Problem List   Diagnosis Date Noted   History of pericarditis 01/29/2019   Heart disease 02/02/2018   Precordial chest pain 12/20/2017   Pericarditis 12/20/2017    PCP: none  REFERRING PROVIDER: Cristy Bonner DASEN, MD  REFERRING DIAG: RSS SAD BT POSTERIOR LABRAL REPAIR   THERAPY DIAG:  Acute pain of right shoulder  Abnormal posture  Muscle weakness (generalized)  Rationale for Evaluation and Treatment: Rehabilitation  ONSET DATE: 09/25/23 DOS  Next MD appt: end of the month  SUBJECTIVE:                                                                                                                                                                                      SUBJECTIVE STATEMENT: Pt attended today's session with reports of 4/10 pain. Pt stated that they have  maintained good compliance with current HEP.  Pt has 6wk f/u with surgeon on 11/07/2023    EVAL: Reports history of gradual onset R shoulder pain leading up to surgery Hand dominance: Right  PERTINENT HISTORY: L shoulder surgery 2017, labral tear  PAIN:  Are you having pain? Yes: NPRS scale: 10/10 at worst Pain location: R shoulder Pain description: ache, sharp Aggravating factors: use Relieving factors: rest, meds  PRECAUTIONS: Shoulder  RED FLAGS: None   WEIGHT BEARING RESTRICTIONS: No  FALLS:  Has patient  fallen in last 6 months? No  OCCUPATION: Desk work  PLOF: Independent  PATIENT GOALS:To use my shoulder again  NEXT MD VISIT:   OBJECTIVE:  Note: Objective measures were completed at Evaluation unless otherwise noted.  DIAGNOSTIC FINDINGS:  None noted  PATIENT SURVEYS:  Quick Dash: 45/55   Minimally Clinically Important Difference (MCID): 15-20 points  (Franchignoni, F. et al. (2013). Minimally clinically important difference of the disabilities of the arm, shoulder, and hand outcome measures (DASH) and its shortened version (Quick DASH). Journal of Orthopaedic & Sports Physical Therapy, 44(1), 30-39)    POSTURE: Rounded shoulders  UPPER EXTREMITY ROM:   Passive ROM Right eval Left eval  Shoulder flexion 90D   Shoulder extension    Shoulder abduction 45d   Shoulder adduction    Shoulder internal rotation 30d   Shoulder external rotation 30d   Elbow flexion WNL   Elbow extension WNL   Wrist flexion WNL   Wrist extension WNL   Wrist ulnar deviation    Wrist radial deviation    Wrist pronation    Wrist supination    (Blank rows = not tested)  UPPER EXTREMITY MMT: N/T  MMT Right eval Left eval  Shoulder flexion    Shoulder extension    Shoulder abduction    Shoulder adduction    Shoulder internal rotation    Shoulder external rotation    Middle trapezius    Lower trapezius    Elbow flexion    Elbow extension    Wrist flexion     Wrist extension    Wrist ulnar deviation    Wrist radial deviation    Wrist pronation    Wrist supination    Grip strength (lbs)    (Blank rows = not tested)  SHOULDER SPECIAL TESTS: defered  JOINT MOBILITY TESTING:  N/A  PALPATION:  N/A                                                                                                                             TREATMENT: [facility] Adult PT Treatment:                                                DATE: 11/09/2023 Therapeutic Activity: Pulley flex/abd 4/4  Seated UE ranger flexion  2x15 Seated UE ranger abduction 2x15 Seated UE ranger ER 2x30 Seated UE ranger chest press 1x20 HEP review and education on phase II exercises     [facility] Adult PT Treatment:                                                DATE: 10/31/2023 Therapeutic Exercise: PROM all directions within protocol limitations Therapeutic Activity: Sherran  testing to ensure all phase 1 goals are met Pt education regarding phase II protocol, prognosis, future HEP changes and answering all pt questions regarding surgery and at home function   [facility] Adult PT Treatment:                                                DATE: 10/17/23 Therapeutic Exercise: Seated scap retraction in sling 2x10 Wrist circumduction x20 CW/CCW Wrist flexion/extension x20 ea Elbow flex/ext AAROM using LUE 2x15 Forearm pronation/supination 2x15 PROM all directions within protocol limitations  [facility] Adult PT Treatment:                                                DATE: 10/13/23 Eval and HEP Self Care: Additional minutes spent for educating on updated Therapeutic Home Exercise Program as well as comparing current status to condition at start of symptoms. This included exercises focusing on stretching, strengthening, with focus on eccentric aspects. Long term goals include an improvement in range of motion, strength, endurance as well as avoiding reinjury. Patient's frequency would include in 1-2  times a day, 3-5 times a week for a duration of 6-12 weeks. Proper technique shown and discussed handout in great detail. All questions were discussed and addressed.      PATIENT EDUCATION: Education details: Discussed eval findings, rehab rationale and POC and patient is in agreement Person educated: Patient Education method: Explanation and Handouts Education comprehension: verbalized understanding and needs further education  HOME EXERCISE PROGRAM: Access Code: [email or web address] URL: [email or web address] Date: 11/09/2023 Prepared by: Mabel Kiang  Exercises - Supine Shoulder Flexion Extension AAROM with Dowel  - 1-2 x daily - 7 x weekly - 1-2 sets - 12 reps - 2s hold - Supine Shoulder Abduction AAROM with Dowel  - 1-2 x daily - 7 x weekly - 1-2 sets - 12 reps - 2s hold - Supine Shoulder External Rotation with Dowel  - 1-2 x daily - 7 x weekly - 1-2 sets - 12 reps - 2s hold - Seated Scapular Retraction  - 1-3 x daily - 7 x weekly - 2-3 sets - 10 reps - 5s hold - Shoulder Flexion Wall Slide with Towel  - 1 x daily - 7 x weekly - 3 sets - 10 reps  ASSESSMENT:  CLINICAL IMPRESSION: Pt attended physical therapy session for continuation of treatment regarding L labral repair. Today's treatment focused on improvement of  A/AAROM and parascapular strengthening. Pt is progressing well with AROM reaching 100d of flexion and 80d of abduction.  Pt showed great tolerance to administered treatment with no adverse effects by the end of session. Skilled intervention was utilized via activity modification for pt tolerance with task completion, functional progression/regression promoting best outcomes inline with current rehab goals, as well as minimal verbal/tactile cuing alongside no physical assistance for safe and appropriate performance of today's activities. Continue with therapeutic focus on Current POC outline.   EVAL: Patient is a 38 y.o. male who was seen today for physical therapy  evaluation and treatment following R shoulder RSS SAD BT POSTERIOR LABRAL REPAIR.  Patient restricted to sling and is currently 3 weks post-op/phase 1 of protocol  OBJECTIVE IMPAIRMENTS: decreased activity tolerance, decreased knowledge of use of DME,  decreased mobility, decreased ROM, decreased strength, increased fascial restrictions, impaired perceived functional ability, increased muscle spasms, impaired UE functional use, postural dysfunction, and pain.   ACTIVITY LIMITATIONS: carrying, lifting, bed mobility, toileting, dressing, and reach over head  PERSONAL FACTORS: Fitness and Time since onset of injury/illness/exacerbation are also affecting patient's functional outcome.   REHAB POTENTIAL: Good  CLINICAL DECISION MAKING: Evolving/moderate complexity  EVALUATION COMPLEXITY: Low   GOALS: Goals reviewed with patient? No  SHORT TERM GOALS: Target date: 11/10/2023  Patient to demonstrate independence in HEP  Baseline: [id number] Goal status: INITIAL  2.  PROM ER 45d Baseline:  Goal status: INITIAL   LONG TERM GOALS: Target date: 12/08/2023    Patient will score at least 25/55 on qDASH to signify clinically meaningful improvement in functional abilities.   Baseline: 45/55 Goal status: INITIAL  2.  Patient will acknowledge 4/10 pain at least once during episode of care  Baseline: 9/10 Goal status: INITIAL  3.  160d AROM flexion an abduction Baseline: 90/45d respectively Goal status: INITIAL  4.  4/5 shoulder strength when appropriate Baseline: TBD Goal status: INITIAL    PLAN:  PT FREQUENCY: 1-2x/week  PT DURATION: 8 weeks  PLANNED INTERVENTIONS: 97110-Therapeutic exercises, 97530- Therapeutic activity, 97112- Neuromuscular re-education, 97535- Self Care, [id number]- Manual therapy, and Patient/Family education  PLAN FOR NEXT SESSION: HEP review and update, manual techniques as appropriate, aerobic tasks, ROM and flexibility activities, strengthening and PREs,  TPDN, gait and balance training as needed     Mabel Kiang, PT, DPT 11/09/2023, 9:14 AM

## 2023-11-10 NOTE — Therapy (Deleted)
 OUTPATIENT PHYSICAL THERAPY TREATMENT NOTE   Patient Name: Jeffrey Wells. MRN: [id number] DOB:[birth year], 38 y.o., male Today's Date: 11/10/2023  END OF SESSION:       Past Medical History:  Diagnosis Date   COVID-19    Past Surgical History:  Procedure Laterality Date   KNEE SURGERY  2011   SHOULDER SURGERY Left    Patient Active Problem List   Diagnosis Date Noted   History of pericarditis 01/29/2019   Heart disease 02/02/2018   Precordial chest pain 12/20/2017   Pericarditis 12/20/2017    PCP: none  REFERRING PROVIDER: Cristy Bonner DASEN, MD  REFERRING DIAG: RSS SAD BT POSTERIOR LABRAL REPAIR   THERAPY DIAG:  No diagnosis found.  Rationale for Evaluation and Treatment: Rehabilitation  ONSET DATE: 09/25/23 DOS  Next MD appt: end of the month  SUBJECTIVE:                                                                                                                                                                                      SUBJECTIVE STATEMENT: Pt attended today's session with reports of 4/10 pain. Pt stated that they have maintained good compliance with current HEP.  Pt has 6wk f/u with surgeon on 11/07/2023    EVAL: Reports history of gradual onset R shoulder pain leading up to surgery Hand dominance: Right  PERTINENT HISTORY: L shoulder surgery 2017, labral tear  PAIN:  Are you having pain? Yes: NPRS scale: 10/10 at worst Pain location: R shoulder Pain description: ache, sharp Aggravating factors: use Relieving factors: rest, meds  PRECAUTIONS: Shoulder  RED FLAGS: None   WEIGHT BEARING RESTRICTIONS: No  FALLS:  Has patient fallen in last 6 months? No  OCCUPATION: Desk work  PLOF: Independent  PATIENT GOALS:To use my shoulder again  NEXT MD VISIT:   OBJECTIVE:  Note: Objective measures were completed at Evaluation unless otherwise noted.  DIAGNOSTIC FINDINGS:  None noted  PATIENT SURVEYS:  Quick Dash:  45/55   Minimally Clinically Important Difference (MCID): 15-20 points  (Franchignoni, F. et al. (2013). Minimally clinically important difference of the disabilities of the arm, shoulder, and hand outcome measures (DASH) and its shortened version (Quick DASH). Journal of Orthopaedic & Sports Physical Therapy, 44(1), 30-39)    POSTURE: Rounded shoulders  UPPER EXTREMITY ROM:   Passive ROM Right eval Left eval  Shoulder flexion 90D   Shoulder extension    Shoulder abduction 45d   Shoulder adduction    Shoulder internal rotation 30d   Shoulder external rotation 30d   Elbow flexion WNL   Elbow extension WNL   Wrist flexion WNL   Wrist extension WNL  Wrist ulnar deviation    Wrist radial deviation    Wrist pronation    Wrist supination    (Blank rows = not tested)  UPPER EXTREMITY MMT: N/T  MMT Right eval Left eval  Shoulder flexion    Shoulder extension    Shoulder abduction    Shoulder adduction    Shoulder internal rotation    Shoulder external rotation    Middle trapezius    Lower trapezius    Elbow flexion    Elbow extension    Wrist flexion    Wrist extension    Wrist ulnar deviation    Wrist radial deviation    Wrist pronation    Wrist supination    Grip strength (lbs)    (Blank rows = not tested)  SHOULDER SPECIAL TESTS: defered  JOINT MOBILITY TESTING:  N/A  PALPATION:  N/A                                                                                                                             TREATMENT: [facility] Adult PT Treatment:                                                DATE: 11/09/2023 Therapeutic Activity: Pulley flex/abd 4/4  Seated UE ranger flexion  2x15 Seated UE ranger abduction 2x15 Seated UE ranger ER 2x30 Seated UE ranger chest press 1x20 HEP review and education on phase II exercises     [facility] Adult PT Treatment:                                                DATE: 10/31/2023 Therapeutic Exercise: PROM all  directions within protocol limitations Therapeutic Activity: Ojective testing to ensure all phase 1 goals are met Pt education regarding phase II protocol, prognosis, future HEP changes and answering all pt questions regarding surgery and at home function   [facility] Adult PT Treatment:                                                DATE: 10/17/23 Therapeutic Exercise: Seated scap retraction in sling 2x10 Wrist circumduction x20 CW/CCW Wrist flexion/extension x20 ea Elbow flex/ext AAROM using LUE 2x15 Forearm pronation/supination 2x15 PROM all directions within protocol limitations  [facility] Adult PT Treatment:  DATE: 10/13/23 Eval and HEP Self Care: Additional minutes spent for educating on updated Therapeutic Home Exercise Program as well as comparing current status to condition at start of symptoms. This included exercises focusing on stretching, strengthening, with focus on eccentric aspects. Long term goals include an improvement in range of motion, strength, endurance as well as avoiding reinjury. Patient's frequency would include in 1-2 times a day, 3-5 times a week for a duration of 6-12 weeks. Proper technique shown and discussed handout in great detail. All questions were discussed and addressed.      PATIENT EDUCATION: Education details: Discussed eval findings, rehab rationale and POC and patient is in agreement Person educated: Patient Education method: Explanation and Handouts Education comprehension: verbalized understanding and needs further education  HOME EXERCISE PROGRAM: Access Code: [email or web address] URL: [email or web address] Date: 11/09/2023 Prepared by: Mabel Kiang  Exercises - Supine Shoulder Flexion Extension AAROM with Dowel  - 1-2 x daily - 7 x weekly - 1-2 sets - 12 reps - 2s hold - Supine Shoulder Abduction AAROM with Dowel  - 1-2 x daily - 7 x weekly - 1-2 sets - 12 reps - 2s hold - Supine Shoulder External  Rotation with Dowel  - 1-2 x daily - 7 x weekly - 1-2 sets - 12 reps - 2s hold - Seated Scapular Retraction  - 1-3 x daily - 7 x weekly - 2-3 sets - 10 reps - 5s hold - Shoulder Flexion Wall Slide with Towel  - 1 x daily - 7 x weekly - 3 sets - 10 reps  ASSESSMENT:  CLINICAL IMPRESSION: Pt attended physical therapy session for continuation of treatment regarding L labral repair. Today's treatment focused on improvement of  A/AAROM and parascapular strengthening. Pt is progressing well with AROM reaching 100d of flexion and 80d of abduction.  Pt showed great tolerance to administered treatment with no adverse effects by the end of session. Skilled intervention was utilized via activity modification for pt tolerance with task completion, functional progression/regression promoting best outcomes inline with current rehab goals, as well as minimal verbal/tactile cuing alongside no physical assistance for safe and appropriate performance of today's activities. Continue with therapeutic focus on Current POC outline.   EVAL: Patient is a 38 y.o. male who was seen today for physical therapy evaluation and treatment following R shoulder RSS SAD BT POSTERIOR LABRAL REPAIR.  Patient restricted to sling and is currently 3 weks post-op/phase 1 of protocol  OBJECTIVE IMPAIRMENTS: decreased activity tolerance, decreased knowledge of use of DME, decreased mobility, decreased ROM, decreased strength, increased fascial restrictions, impaired perceived functional ability, increased muscle spasms, impaired UE functional use, postural dysfunction, and pain.   ACTIVITY LIMITATIONS: carrying, lifting, bed mobility, toileting, dressing, and reach over head  PERSONAL FACTORS: Fitness and Time since onset of injury/illness/exacerbation are also affecting patient's functional outcome.   REHAB POTENTIAL: Good  CLINICAL DECISION MAKING: Evolving/moderate complexity  EVALUATION COMPLEXITY: Low   GOALS: Goals reviewed  with patient? No  SHORT TERM GOALS: Target date: 11/10/2023  Patient to demonstrate independence in HEP  Baseline: [id number] Goal status: INITIAL  2.  PROM ER 45d Baseline:  Goal status: INITIAL   LONG TERM GOALS: Target date: 12/08/2023    Patient will score at least 25/55 on qDASH to signify clinically meaningful improvement in functional abilities.   Baseline: 45/55 Goal status: INITIAL  2.  Patient will acknowledge 4/10 pain at least once during episode of care  Baseline: 9/10 Goal status: INITIAL  3.  160d AROM  flexion an abduction Baseline: 90/45d respectively Goal status: INITIAL  4.  4/5 shoulder strength when appropriate Baseline: TBD Goal status: INITIAL    PLAN:  PT FREQUENCY: 1-2x/week  PT DURATION: 8 weeks  PLANNED INTERVENTIONS: 97110-Therapeutic exercises, 97530- Therapeutic activity, 97112- Neuromuscular re-education, 97535- Self Care, [id number]- Manual therapy, and Patient/Family education  PLAN FOR NEXT SESSION: HEP review and update, manual techniques as appropriate, aerobic tasks, ROM and flexibility activities, strengthening and PREs, TPDN, gait and balance training as needed     Mabel Kiang, PT, DPT 11/10/2023, 10:06 AM

## 2023-11-14 ENCOUNTER — Telehealth: Payer: Self-pay

## 2023-11-14 ENCOUNTER — Ambulatory Visit

## 2023-11-14 NOTE — Telephone Encounter (Signed)
 TC due to missed visit.  VM left regarding next scheduled visit.

## 2023-11-17 ENCOUNTER — Ambulatory Visit: Admitting: Physical Therapy

## 2023-11-17 ENCOUNTER — Telehealth: Payer: Self-pay | Admitting: Physical Therapy

## 2023-11-17 NOTE — Telephone Encounter (Signed)
 2nd no-show. Called pt and left voicemail to state he had missed his 10:15am visit and currently has no other visits scheduled. Let pt know that we will d/c him from PT and inform Dr. Cristy if we receive no call back for scheduling within this next week.   Amariona Rathje April Ma L Kasidi Shanker, PT, DPT

## 2023-11-17 NOTE — Therapy (Deleted)
 OUTPATIENT PHYSICAL THERAPY TREATMENT NOTE   Patient Name: Jeffrey Wells. MRN: [id number] DOB:28-Nov-1985, 38 y.o., male Today's Date: 11/17/2023  END OF SESSION:       Past Medical History:  Diagnosis Date   COVID-19    Past Surgical History:  Procedure Laterality Date   KNEE SURGERY  2011   SHOULDER SURGERY Left    Patient Active Problem List   Diagnosis Date Noted   History of pericarditis 01/29/2019   Heart disease 02/02/2018   Precordial chest pain 12/20/2017   Pericarditis 12/20/2017    PCP: none  REFERRING PROVIDER: Cristy Bonner DASEN, MD  REFERRING DIAG: RSS SAD BT POSTERIOR LABRAL REPAIR   THERAPY DIAG:  No diagnosis found.  Rationale for Evaluation and Treatment: Rehabilitation  ONSET DATE: 09/25/23 DOS  Next MD appt: end of the month  SUBJECTIVE:                                                                                                                                                                                      SUBJECTIVE STATEMENT: *** Pt attended today's session with reports of 4/10 pain. Pt stated that they have maintained good compliance with current HEP.  Pt has 6wk f/u with surgeon on 11/07/2023    EVAL: Reports history of gradual onset R shoulder pain leading up to surgery Hand dominance: Right  PERTINENT HISTORY: L shoulder surgery 2017, labral tear  PAIN:  Are you having pain? Yes: NPRS scale: 10/10 at worst Pain location: R shoulder Pain description: ache, sharp Aggravating factors: use Relieving factors: rest, meds  PRECAUTIONS: Shoulder  RED FLAGS: None   WEIGHT BEARING RESTRICTIONS: No  FALLS:  Has patient fallen in last 6 months? No  OCCUPATION: Desk work  PLOF: Independent  PATIENT GOALS:To use my shoulder again  NEXT MD VISIT:   OBJECTIVE:  Note: Objective measures were completed at Evaluation unless otherwise noted.  DIAGNOSTIC FINDINGS:  None noted  PATIENT SURVEYS:  Quick Dash:  45/55   Minimally Clinically Important Difference (MCID): 15-20 points  (Franchignoni, F. et al. (2013). Minimally clinically important difference of the disabilities of the arm, shoulder, and hand outcome measures (DASH) and its shortened version (Quick DASH). Journal of Orthopaedic & Sports Physical Therapy, 44(1), 30-39)    POSTURE: Rounded shoulders  UPPER EXTREMITY ROM:   Passive ROM Right eval Left eval  Shoulder flexion 90D   Shoulder extension    Shoulder abduction 45d   Shoulder adduction    Shoulder internal rotation 30d   Shoulder external rotation 30d   Elbow flexion WNL   Elbow extension WNL   Wrist flexion WNL   Wrist extension WNL  Wrist ulnar deviation    Wrist radial deviation    Wrist pronation    Wrist supination    (Blank rows = not tested)  UPPER EXTREMITY MMT: N/T  MMT Right eval Left eval  Shoulder flexion    Shoulder extension    Shoulder abduction    Shoulder adduction    Shoulder internal rotation    Shoulder external rotation    Middle trapezius    Lower trapezius    Elbow flexion    Elbow extension    Wrist flexion    Wrist extension    Wrist ulnar deviation    Wrist radial deviation    Wrist pronation    Wrist supination    Grip strength (lbs)    (Blank rows = not tested)  SHOULDER SPECIAL TESTS: defered  JOINT MOBILITY TESTING:  N/A  PALPATION:  N/A                                                                                                                             TREATMENT: [facility] Adult PT Treatment:                                                DATE: 11/17/2023 Therapeutic Activity: Pulley flex/abd 4/4  Seated UE ranger flexion  2x15 Seated UE ranger abduction 2x15 Seated UE ranger ER 2x30 Seated UE ranger chest press 1x20 HEP review and education on phase II exercises   [facility] Adult PT Treatment:                                                DATE: 11/09/2023 Therapeutic Activity: Pulley  flex/abd 4/4  Seated UE ranger flexion  2x15 Seated UE ranger abduction 2x15 Seated UE ranger ER 2x30 Seated UE ranger chest press 1x20 HEP review and education on phase II exercises     [facility] Adult PT Treatment:                                                DATE: 10/31/2023 Therapeutic Exercise: PROM all directions within protocol limitations Therapeutic Activity: Ojective testing to ensure all phase 1 goals are met Pt education regarding phase II protocol, prognosis, future HEP changes and answering all pt questions regarding surgery and at home function     PATIENT EDUCATION: Education details: Discussed eval findings, rehab rationale and POC and patient is in agreement Person educated: Patient Education method: Explanation and Handouts Education comprehension: verbalized understanding and needs further education  HOME EXERCISE PROGRAM: Access Code: [email or web address] URL: [email or web address] Date: 11/09/2023  Prepared by: Mabel Kiang  Exercises - Supine Shoulder Flexion Extension AAROM with Dowel  - 1-2 x daily - 7 x weekly - 1-2 sets - 12 reps - 2s hold - Supine Shoulder Abduction AAROM with Dowel  - 1-2 x daily - 7 x weekly - 1-2 sets - 12 reps - 2s hold - Supine Shoulder External Rotation with Dowel  - 1-2 x daily - 7 x weekly - 1-2 sets - 12 reps - 2s hold - Seated Scapular Retraction  - 1-3 x daily - 7 x weekly - 2-3 sets - 10 reps - 5s hold - Shoulder Flexion Wall Slide with Towel  - 1 x daily - 7 x weekly - 3 sets - 10 reps  ASSESSMENT:  CLINICAL IMPRESSION: *** Pt attended physical therapy session for continuation of treatment regarding L labral repair. Today's treatment focused on improvement of  A/AAROM and parascapular strengthening. Pt is progressing well with AROM reaching 100d of flexion and 80d of abduction.  Pt showed great tolerance to administered treatment with no adverse effects by the end of session. Skilled intervention was utilized via  activity modification for pt tolerance with task completion, functional progression/regression promoting best outcomes inline with current rehab goals, as well as minimal verbal/tactile cuing alongside no physical assistance for safe and appropriate performance of today's activities. Continue with therapeutic focus on Current POC outline.   EVAL: Patient is a 38 y.o. male who was seen today for physical therapy evaluation and treatment following R shoulder RSS SAD BT POSTERIOR LABRAL REPAIR.  Patient restricted to sling and is currently 3 weks post-op/phase 1 of protocol  OBJECTIVE IMPAIRMENTS: decreased activity tolerance, decreased knowledge of use of DME, decreased mobility, decreased ROM, decreased strength, increased fascial restrictions, impaired perceived functional ability, increased muscle spasms, impaired UE functional use, postural dysfunction, and pain.   ACTIVITY LIMITATIONS: carrying, lifting, bed mobility, toileting, dressing, and reach over head  PERSONAL FACTORS: Fitness and Time since onset of injury/illness/exacerbation are also affecting patient's functional outcome.   REHAB POTENTIAL: Good  CLINICAL DECISION MAKING: Evolving/moderate complexity  EVALUATION COMPLEXITY: Low   GOALS: Goals reviewed with patient? No  SHORT TERM GOALS: Target date: 11/10/2023  Patient to demonstrate independence in HEP  Baseline: [id number] Goal status: INITIAL  2.  PROM ER 45d Baseline:  Goal status: INITIAL   LONG TERM GOALS: Target date: 12/08/2023    Patient will score at least 25/55 on qDASH to signify clinically meaningful improvement in functional abilities.   Baseline: 45/55 Goal status: INITIAL  2.  Patient will acknowledge 4/10 pain at least once during episode of care  Baseline: 9/10 Goal status: INITIAL  3.  160d AROM flexion an abduction Baseline: 90/45d respectively Goal status: INITIAL  4.  4/5 shoulder strength when appropriate Baseline: TBD Goal status:  INITIAL    PLAN:  PT FREQUENCY: 1-2x/week  PT DURATION: 8 weeks  PLANNED INTERVENTIONS: 97110-Therapeutic exercises, 97530- Therapeutic activity, 97112- Neuromuscular re-education, 97535- Self Care, [id number]- Manual therapy, and Patient/Family education  PLAN FOR NEXT SESSION: HEP review and update, manual techniques as appropriate, aerobic tasks, ROM and flexibility activities, strengthening and PREs, TPDN, gait and balance training as needed     Shenita Trego April Ma L Chynna Buerkle, PT, DPT 11/17/2023, 7:56 AM
